# Patient Record
Sex: Female | Born: 1995 | Race: White | Hispanic: No | Marital: Single | Smoking: Current every day smoker
Health system: Southern US, Community
[De-identification: ages and names within clinical notes are randomized; demographics above are authoritative.]

## PROBLEM LIST (undated history)

## (undated) ENCOUNTER — Inpatient Hospital Stay (HOSPITAL_COMMUNITY): Payer: Self-pay

## (undated) DIAGNOSIS — Z789 Other specified health status: Secondary | ICD-10-CM

## (undated) DIAGNOSIS — D649 Anemia, unspecified: Secondary | ICD-10-CM

## (undated) DIAGNOSIS — F99 Mental disorder, not otherwise specified: Secondary | ICD-10-CM

## (undated) DIAGNOSIS — I959 Hypotension, unspecified: Secondary | ICD-10-CM

## (undated) HISTORY — PX: NO PAST SURGERIES: SHX2092

## (undated) HISTORY — DX: Mental disorder, not otherwise specified: F99

## (undated) HISTORY — DX: Anemia, unspecified: D64.9

---
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
## 2014-01-23 ENCOUNTER — Telehealth: Payer: Self-pay | Admitting: General Practice

## 2014-01-23 ENCOUNTER — Ambulatory Visit: Payer: Medicaid Other | Admitting: Obstetrics & Gynecology

## 2014-01-23 NOTE — Telephone Encounter (Signed)
Patient no showed for appt today. Called patient and she states she knows she missed her appt and would like it rescheduled. Told patient I would have the front office staff contact her. Patient verbalized understanding and had no questions

## 2014-03-23 ENCOUNTER — Ambulatory Visit: Payer: Medicaid Other | Admitting: Obstetrics and Gynecology

## 2014-03-23 ENCOUNTER — Encounter: Payer: Self-pay | Admitting: Obstetrics and Gynecology

## 2014-03-24 ENCOUNTER — Encounter: Payer: Self-pay | Admitting: General Practice

## 2014-04-14 ENCOUNTER — Encounter (HOSPITAL_COMMUNITY): Payer: Self-pay | Admitting: Emergency Medicine

## 2014-04-14 ENCOUNTER — Emergency Department (HOSPITAL_COMMUNITY)
Admission: EM | Admit: 2014-04-14 | Discharge: 2014-04-15 | Disposition: A | Discharge status: Home / Self Care | Payer: Medicaid Other | Attending: Emergency Medicine | Admitting: Emergency Medicine

## 2014-04-14 DIAGNOSIS — Z8679 Personal history of other diseases of the circulatory system: Secondary | ICD-10-CM | POA: Insufficient documentation

## 2014-04-14 DIAGNOSIS — R51 Headache: Secondary | ICD-10-CM | POA: Insufficient documentation

## 2014-04-14 DIAGNOSIS — Z880 Allergy status to penicillin: Secondary | ICD-10-CM | POA: Diagnosis not present

## 2014-04-14 DIAGNOSIS — J039 Acute tonsillitis, unspecified: Secondary | ICD-10-CM | POA: Insufficient documentation

## 2014-04-14 DIAGNOSIS — R110 Nausea: Secondary | ICD-10-CM | POA: Insufficient documentation

## 2014-04-14 DIAGNOSIS — Z3202 Encounter for pregnancy test, result negative: Secondary | ICD-10-CM | POA: Insufficient documentation

## 2014-04-14 DIAGNOSIS — F17200 Nicotine dependence, unspecified, uncomplicated: Secondary | ICD-10-CM | POA: Diagnosis not present

## 2014-04-14 DIAGNOSIS — J029 Acute pharyngitis, unspecified: Secondary | ICD-10-CM | POA: Diagnosis present

## 2014-04-14 DIAGNOSIS — J038 Acute tonsillitis due to other specified organisms: Secondary | ICD-10-CM

## 2014-04-14 DIAGNOSIS — B9689 Other specified bacterial agents as the cause of diseases classified elsewhere: Secondary | ICD-10-CM

## 2014-04-14 LAB — RAPID STREP SCREEN (MED CTR MEBANE ONLY): Streptococcus, Group A Screen (Direct): NEGATIVE

## 2014-04-14 MED ORDER — KETOROLAC TROMETHAMINE 60 MG/2ML IM SOLN: 60.0000 mg | Freq: Once | INTRAMUSCULAR | Status: DC

## 2014-04-14 MED ORDER — PROCHLORPERAZINE MALEATE 10 MG PO TABS: 10.0000 mg | ORAL_TABLET | Freq: Once | ORAL | Status: DC

## 2014-04-14 NOTE — ED Notes (Signed)
Patient here with complaint of sore throat and headache. States that headache has been present since the birth of her child several months ago. Tonight headache is largely unchanged, but patient states that medications prescribed are not helping to relieve it. Additionally patient developed a sore throat about a week ago which she states became worse yesterday and she fears it may be strep throat. Reports fevers at home with some body aches yesterday. No fever present upon arrival to ED.

## 2014-04-14 NOTE — ED Provider Notes (Signed)
CSN: [id number]     Arrival date & time 04/14/14  1933 History   First MD Initiated Contact with Patient 04/14/14 2254     Chief Complaint  Patient presents with  . Migraine  . Sore Throat     (Consider location/radiation/quality/duration/timing/severity/associated sxs/prior Treatment) HPI Catherine Bruce is a 18 year old female with past medical history of hypotension, recurrent headaches status post her last pregnancy ending 4 months ago with 3 days of sore throat and 4 months of headache. Patient states her headaches have been intermittent for the past 4 months, and she has seen her primary care physician for same. She states there is nothing new regarding her headache tonight. Patient states her sore throat began on Wednesday, and her last 3 days gradually became worse. Patient states she has had multiple strep infections in the past and is worried about a recurrent strep infection tonight. She reports associated nonproductive cough, subjective fever of up to 101 yesterday, and some mild associated nausea which patient states is typical of her headaches. Patient denies any associated shortness of breath, trouble swallowing, drooling, jaw stiffness, neck stiffness, vomiting, abdominal pain, dysuria. Patient states she has not tried taking anything for her pain.  Past Medical History  Diagnosis Date  . Medical history non-contributory   . Hypotension    Past Surgical History  Procedure Laterality Date  . No past surgeries     Family History  Problem Relation Age of Onset  . Asthma Mother   . Asthma Son    History  Substance Use Topics  . Smoking status: Current Every Day Smoker -- 0.25 packs/day  . Smokeless tobacco: Never Used  . Alcohol Use: No   OB History   Grav Para Term Preterm Abortions TAB SAB Ect Mult Living   0      2     Review of Systems  Constitutional: Positive for fever. Negative for chills.  HENT: Positive for postnasal drip and sore throat. Negative for  congestion, dental problem, drooling, facial swelling, rhinorrhea, sinus pressure, sneezing, trouble swallowing and voice change.   Eyes: Negative for visual disturbance.  Respiratory: Positive for cough. Negative for shortness of breath, wheezing and stridor.   Cardiovascular: Negative for chest pain.  Gastrointestinal: Positive for nausea. Negative for vomiting and abdominal pain.  Genitourinary: Negative for dysuria.  Musculoskeletal: Negative for neck pain and neck stiffness.  Skin: Negative for rash.  Neurological: Negative for dizziness, weakness, numbness and headaches.  Psychiatric/Behavioral: Negative.       Allergies  Penicillins  Home Medications   Prior to Admission medications   Medication Sig Start Date End Date Taking? Authorizing Provider  azithromycin (ZITHROMAX) 500 MG tablet Take 1 tablet (500 mg total) by mouth daily. Use once daily until finished. 04/15/14   Monte Fantasia, PA-C  ibuprofen (ADVIL,MOTRIN) 600 MG tablet Take 1 tablet (600 mg total) by mouth every 6 (six) hours as needed. 04/15/14   Monte Fantasia, PA-C   BP 94/58  Pulse 75  Temp(Src) 98.3 F (36.8 C) (Oral)  Resp 16  Ht  (1.422 m)  Wt 88 lb (39.917 kg)  BMI 19.74 kg/m2  SpO2 100%  LMP 03/10/2013  Breastfeeding? No Physical Exam  Nursing note and vitals reviewed. Constitutional: She is oriented to person, place, and time. She appears well-developed and well-nourished. No distress.  HENT:  Head: Normocephalic and atraumatic.  Nose: Nose normal.  Mouth/Throat: Uvula is midline. No trismus in the jaw. No uvula swelling.  Posterior oropharyngeal edema and posterior oropharyngeal erythema present. No oropharyngeal exudate or tonsillar abscesses.  Mild edema, erythema noted to patient's tonsils bilaterally. Patient's tonsils swollen almost to the point of touching uvula. No uvular swelling, no obvious tonsillar abscess. Patient's posterior oropharynx not occluded by swelling. Exudates noted on  tonsils bilaterally.  Eyes: Right eye exhibits no discharge. Left eye exhibits no discharge. No scleral icterus.  Neck: Normal range of motion and full passive range of motion without pain. Neck supple. No rigidity. No edema, no erythema and normal range of motion present.  Cardiovascular: Normal rate, regular rhythm and normal heart sounds.   No murmur heard. Pulmonary/Chest: Effort normal and breath sounds normal. No respiratory distress.  Abdominal: Soft. There is no tenderness.  Musculoskeletal: Normal range of motion. She exhibits no edema and no tenderness.  Neurological: She is alert and oriented to person, place, and time. She has normal strength. No cranial nerve deficit or sensory deficit. She displays a negative Romberg sign. Coordination and gait normal.  Skin: Skin is warm and dry. No rash noted. She is not diaphoretic.  Psychiatric: She has a normal mood and affect.    ED Course  Procedures (including critical care time) Labs Review Labs Reviewed  URINALYSIS, ROUTINE W REFLEX MICROSCOPIC - Abnormal; Notable for the following:    Color, Urine AMBER (*)    APPearance CLOUDY (*)    Bilirubin Urine SMALL (*)    Ketones, ur 15 (*)    All other components within normal limits  RAPID STREP SCREEN  CULTURE, GROUP A STREP  PREGNANCY, URINE    Imaging Review No results found.   EKG Interpretation None      MDM   Final diagnoses:  Acute bacterial tonsillitis    18 year old female with chronic headache and new sore throat. Workup tonight for strep pharyngitis/tonsillitis. We'll treat patient's pain in her throat and her headache, however I do not believe this headache warrants any further workup based on patient's history and benign neuro exam. Patient's tonsils are swollen, however patient states she has been told by a primary care physician in the past that she may require tonsillectomy in the future due to to chronic swollen tonsils. Patient mildly hypotensive, however  patient states this is baseline for her and she has been worked up for in the past. With this history and the patient's weight, I am not concerned about her current blood pressure.  Pt afebrile with tonsillar exudate, cervical lymphadenopathy, & dysphagia; diagnosis of strep. Treated in the Ed with NSAID and Compazine for mild nausea.  Pt appears mildly dehydrated, discussed importance of water rehydration. Presentation non concerning for PTA or infxn spread to soft tissue. No trismus or uvula deviation. Specific return precautions discussed. Pt able to drink water in ED without difficulty with intact air way. Based on Cent[facility] criteria pt has tonsillar exudates, tender anterior cervical adenopathy, fever by history. We will therefore treat with antibiotics outpatient and recommended close followup with her primary care physician. Patient states she currently does not have a primary care physician. We provided her with resource guide to help her find one. We encouraged patient to call or return to the ER should her symptoms change, worsen or should she have any questions or concerns.     BP 94/58  Pulse 75  Temp(Src) 98.3 F (36.8 C) (Oral)  Resp 16  Ht  (1.422 m)  Wt 88 lb (39.917 kg)  BMI 19.74 kg/m2  SpO2 100%  LMP 03/10/2013  Breastfeeding? No  Signed,  Ladona Mow, PA-C 1:55 AM  This patient discussed with Dr. Mirian Mo, MD.     Monte Fantasia, PA-C 04/15/14 0155

## 2014-04-15 LAB — URINALYSIS, ROUTINE W REFLEX MICROSCOPIC
GLUCOSE, UA: NEGATIVE mg/dL
Hgb urine dipstick: NEGATIVE
Ketones, ur: 15 mg/dL — AB
LEUKOCYTES UA: NEGATIVE
Nitrite: NEGATIVE
PH: 5.5 (ref 5.0–8.0)
Protein, ur: NEGATIVE mg/dL
SPECIFIC GRAVITY, URINE: 1.028 (ref 1.005–1.030)
Urobilinogen, UA: 1 mg/dL (ref 0.0–1.0)

## 2014-04-15 LAB — PREGNANCY, URINE: PREG TEST UR: NEGATIVE

## 2014-04-15 MED ORDER — KETOROLAC TROMETHAMINE 60 MG/2ML IM SOLN
60.0000 mg | Freq: Once | INTRAMUSCULAR | Status: AC
  Administered 2014-04-15: 60 mg via INTRAMUSCULAR
  Filled 2014-04-15: qty 2

## 2014-04-15 MED ORDER — PROCHLORPERAZINE MALEATE 10 MG PO TABS
10.0000 mg | ORAL_TABLET | Freq: Once | ORAL | Status: AC
  Administered 2014-04-15: 10 mg via ORAL
  Filled 2014-04-15: qty 1

## 2014-04-15 MED ORDER — IBUPROFEN 600 MG PO TABS: 600.0000 mg | ORAL_TABLET | Freq: Four times a day (QID) | ORAL | Status: DC | PRN

## 2014-04-15 MED ORDER — AZITHROMYCIN 500 MG PO TABS: 500.0000 mg | ORAL_TABLET | Freq: Every day | ORAL | Status: DC

## 2014-04-15 NOTE — ED Notes (Signed)
Pt. Stable and ambulatory at discharge. Alert and oriented x4. Denies further needs at this time.

## 2014-04-15 NOTE — Discharge Instructions (Signed)
Use azithromycin 1 tablet once a day until finished. You may alternate ibuprofen and Tylenol every 3-4 hours as needed for pain. Return to the ER if you develop any high fever, trouble breathing, trouble swallowing, drooling, neck stiffness, jaw stiffness, nausea vomiting. Followup with primary care physician. Refer to resource guide below.   Tonsillitis Tonsillitis is an infection of the throat that causes the tonsils to become red, tender, and swollen. Tonsils are collections of lymphoid tissue at the back of the throat. Each tonsil has crevices (crypts). Tonsils help fight nose and throat infections and keep infection from spreading to other parts of the body for the first 18 months of life.  CAUSES Sudden (acute) tonsillitis is usually caused by infection with streptococcal bacteria. Long-lasting (chronic) tonsillitis occurs when the crypts of the tonsils become filled with pieces of food and bacteria, which makes it easy for the tonsils to become repeatedly infected. SYMPTOMS  Symptoms of tonsillitis include:  A sore throat, with possible difficulty swallowing.  White patches on the tonsils.  Fever.  Tiredness.  New episodes of snoring during sleep, when you did not snore before.  Small, foul-smelling, yellowish-white pieces of material (tonsilloliths) that you occasionally cough up or spit out. The tonsilloliths can also cause you to have bad breath. DIAGNOSIS Tonsillitis can be diagnosed through a physical exam. Diagnosis can be confirmed with the results of lab tests, including a throat culture. TREATMENT  The goals of tonsillitis treatment include the reduction of the severity and duration of symptoms and prevention of associated conditions. Symptoms of tonsillitis can be improved with the use of steroids to reduce the swelling. Tonsillitis caused by bacteria can be treated with antibiotic medicines. Usually, treatment with antibiotic medicines is started before the cause of the  tonsillitis is known. However, if it is determined that the cause is not bacterial, antibiotic medicines will not treat the tonsillitis. If attacks of tonsillitis are severe and frequent, your health care provider may recommend surgery to remove the tonsils (tonsillectomy). HOME CARE INSTRUCTIONS   Rest as much as possible and get plenty of sleep.  Drink plenty of fluids. While the throat is very sore, eat soft foods or liquids, such as sherbet, soups, or instant breakfast drinks.  Eat frozen ice pops.  Gargle with a warm or cold liquid to help soothe the throat. Mix 1/4 teaspoon of salt and 1/4 teaspoon of baking soda in 8 oz of water. SEEK MEDICAL CARE IF:   Large, tender lumps develop in your neck.  A rash develops.  A green, yellow-brown, or bloody substance is coughed up.  You are unable to swallow liquids or food for 24 hours.  You notice that only one of the tonsils is swollen. SEEK IMMEDIATE MEDICAL CARE IF:   You develop any new symptoms such as vomiting, severe headache, stiff neck, chest pain, or trouble breathing or swallowing.  You have severe throat pain along with drooling or voice changes.  You have severe pain, unrelieved with recommended medications.  You are unable to fully open the mouth.  You develop redness, swelling, or severe pain anywhere in the neck.  You have a fever. MAKE SURE YOU:   Understand these instructions.  Will watch your condition.  Will get help right away if you are not doing well or get worse. Document Released: 04/30/2005 Document Revised: 12/05/2013 Document Reviewed: 01/07/2013 [facility] Patient Information 2015 [place], [place]. This information is not intended to replace advice given to you by your health care provider. Make  sure you discuss any questions you have with your health care provider.   Emergency Department Resource Guide 1) Find a Doctor and Pay Out of Pocket Although you won't have to find out who is covered by  your insurance plan, it is a good idea to ask around and get recommendations. You will then need to call the office and see if the doctor you have chosen will accept you as a new patient and what types of options they offer for patients who are self-pay. Some doctors offer discounts or will set up payment plans for their patients who do not have insurance, but you will need to ask so you aren't surprised when you get to your appointment.  2) Contact Your Local Health Department Not all health departments have doctors that can see patients for sick visits, but many do, so it is worth a call to see if yours does. If you don't know where your local health department is, you can check in your phone book. The CDC also has a tool to help you locate your state's health department, and many state websites also have listings of all of their local health departments.  3) Find a Walk-in Clinic If your illness is not likely to be very severe or complicated, you may want to try a walk in clinic. These are popping up all over the country in pharmacies, drugstores, and shopping centers. They're usually staffed by nurse practitioners or physician assistants that have been trained to treat common illnesses and complaints. They're usually fairly quick and inexpensive. However, if you have serious medical issues or chronic medical problems, these are probably not your best option.  No Primary Care Doctor: - Call Health Connect at  [phone] - they can help you locate a primary care doctor that  accepts your insurance, provides certain services, etc. - Physician Referral Service- [phone]  Chronic Pain Problems: Organization         Address  Phone   Notes  [facility]  [phone] Patients need to be referred by their primary care doctor.   Medication Assistance: Organization         Address  Phone   Notes  [facility] Medication [facility] [address] [place] [place], [place] [address] [phone] --Must be a resident of [facility] -- Must have NO insurance coverage whatsoever (no Medicaid/ Medicare, etc.) -- The pt. MUST have a primary care doctor that directs their care regularly and follows them in the community   [facility]  [phone]   [employer]  [phone]    Agencies that provide inexpensive medical care: Organization         Address  Phone   Notes  [facility]  [phone]   [facility]    [phone]   [facility] [address] [place], [place] [address] [phone]   [facility] [address], [place] [phone]   Planned Parenthood    [phone]   [facility]    [phone]   [facility] and [facility]  [address], [place] Phone:  [phone], Fax:  [phone] Hours of Operation:  9 am - 6 pm, M-F.  Also accepts Medicaid/Medicare and self-pay.  [facility]  [address], [place] Phone: [phone], Fax: [phone]. Hours of Operation:  8:30 am -  5:30 pm, M-F.  Also accepts Medicaid and self-pay.  [facility] [place] [address] [place] Phone: [phone]   [facility] [address] Natasha Bence [place], [place] [phone], Ext. [phone] Mondays & Thursdays: 7-9 AM.  First 15 patients are seen on a first come, first serve basis.    Medicaid-accepting [facility] Providers:  Organization         Address  Phone   Notes  [facility] [address], [place] [phone] Also accepts self-pay patients.  [facility] [address] Laurell Josephs [place], [place]  [phone]   [facility] [place] [address], [place] [phone]   [facility] [place] [facility] [address], [place] [phone]   Renaye Rakers [address], [place]   [phone] Only accepts [place] patients after they have their name applied to their card.   Self-Pay (no insurance) in [facility]:  Organization         Address  Phone   Notes  Sickle Cell Patients, [facility] [address] [place], [place] [phone]   [facility] [address] [place], [place] [phone]   [facility] [place]  [address], [place] [phone]   [facility]/Dr. Osei-Bonsu  [address] [place] or [address], [place] [phone] Phone number for both [place] and [place] locations is the same.  [facility] [address] [place] [phone]   [facility] [address] [place] or [address] [phone]   [facility] [address], [place] [phone], phone; [phone], fax Sees patients 1st and 3rd Saturday of every month.  Must not qualify for public or private insurance (i.e. Medicaid, Medicare, [place] Health Choice, Veterans' Benefits)  Household income should be no more than 200% of the poverty level The clinic cannot treat you if you are pregnant or think you are pregnant  Sexually transmitted diseases are not treated at the clinic.    Dental Care: Organization         Address  Phone  Notes  [facility] Department of [facility] [address] [place], [place] [phone] Accepts children up to age 36 who are enrolled in [place] or [place] Health Choice; pregnant women with a Medicaid card; and children who have applied for Medicaid or [place] Health Choice, but were declined, whose parents can pay a reduced fee at time of service.  [facility] Department of [facility]  [address], [place] [phone] Accepts children up to age 57 who are enrolled  in [place] or [place] Health Choice; pregnant women with a Medicaid card; and children who have applied for Medicaid or [place] Health Choice, but were declined, whose parents can pay a reduced fee at time of service.  [facility]  [address] [place], [place] [phone] Patients are seen by appointment only. Walk-ins are not accepted. [facility] will see patients 11 years of age and older. Monday - Tuesday (8am-5pm) Most Wednesdays (8:30-5pm) $30 per visit, cash only  [facility] Adult Dental Access PROGRAM  [address], [facility] [phone] Patients are seen by appointment only. Walk-ins are  not accepted. [facility] will see patients 90 years of age and older. One Wednesday Evening (Monthly: Volunteer Based).  $30 per visit, cash only  [employer]  [phone] for adults; Children under age 45, call Graduate Pediatric Dentistry at [phone]. Children aged 32-14, please call [phone] to request a pediatric application.  Dental services are provided in all areas of dental care including fillings, crowns and bridges, complete and partial dentures, implants, gum treatment, root canals, and extractions. Preventive care is also provided. Treatment is provided to both adults and children. Patients are selected via a lottery and there is often a waiting list.   [facility] [address] [place]  [phone] [email or web address]   [facility] [address] [place], [place] [phone], Ext. [phone] Second and Fourth Thursday of each month, opens at 6:30 AM; Clinic ends at 9 AM.  Patients are seen on a first-come first-served basis, and a limited number are seen during each clinic.   [facility]  [address] Ether Griffins [place], [place] [phone]   Eligibility Requirements You must have lived in [place], [place], or [place] counties for at least the last three months.   You cannot be  eligible for state or federal sponsored [place], including [employer], [place], or [employer].   You generally cannot be eligible for healthcare insurance through your employer.    How to apply: Eligibility screenings are held every Tuesday and Wednesday afternoon from 1:00 pm until 4:00 pm. You do not need an appointment for the interview!  [facility] [address], [place], [place] [phone]   [facility] Health Department  [phone]   [facility] Health Department  [phone]   [facility] Health Department  [phone]    Behavioral Health Resources in the Community: Intensive Outpatient Programs Organization         Address  Phone  Notes  [facility] [address] [place], [place] [phone]   [facility] Outpatient [address], [place], [place] [phone]   [facility] [address], [place], [place]  [phone]   [facility] Mental Health [address]  [place], [place] [phone] or [phone]   Substance Abuse Resources Organization         Address  Phone  Notes  [facility]  [phone]   [facility]  [phone]   [facility] [place]  [phone]   Floydene Flock  [phone]   Residential & Outpatient Substance Abuse Program  3134744888   Psychological Services Organization         Address  Phone  Notes  [facility]al Health  [phone]   [facility]  [phone]   [facility] Mental Health [address], [place] [phone] or [phone]    Mobile Crisis Teams Organization         Address  Phone  Notes  [facility], Mobile Crisis Care Unit  [phone]   [facility]  [address] [place], [place] [phone]   Doristine Locks [address] [place] [phone]    Self-Help/Support Groups Organization          Address  Phone             Notes  [facility] [place] - variety of support groups  336- I7437963 Call for more information  Narcotics Anonymous (NA),  [facility] [address], [employer] [place]  2 meetings at this location   Residential Sports administrator         Address  Phone  Notes  [facility] [address],    [place]  [phone]   [facility]  [address] [place] [address], [place], [place] [phone]   [facility] [address] [place], [place] [phone] Admissions: 8am-3pm M-F  [facility] [address],    [place], [place] [phone]   [facility] [address] [place], [place], [place] [phone]   [facility] [address],  [place], [place] [phone]   [facility] - Intensive Outpatient [address] Laurell Joseph[address], [place], [place] [phone]   [facility] ([facility] [address] [place].,  [place], [place] [phone] or [phone]   [facility] ([facility]) [address], [place], [place] [phone] Accepts Medicaid  [facility] [place] [address]  [place] [phone] Substance Abuse/Addiction Treatment   [facility] Organization         Address  Phone  Notes  [facility]  [phone]   Angie Fava, PhD [address] Ervin Knack [place], [place]   [phone] or [phone]   [facility] Behavioral   [address] [place], [place] [phone]   [facility] [address] [place], [place] [phone] Insurance/Medicaid/sponsorship through [facility] and Families [address]                                    [place], [place] [phone] Therapy/tele-psych/case  [facility] [address], [place] [phone]    Dr. Lolly Mustache  [phone]   [facility]  [employer]  [facility] Dept. 1) [address], [place] 2) [address] [place] 3)  [address], [place] [phone]  [phone]   [facility] Child Abuse Hotline 440-815-4560 or [phone] (After Hours)

## 2014-04-16 LAB — CULTURE, GROUP A STREP

## 2014-04-16 NOTE — ED Provider Notes (Signed)
Medical screening examination/treatment/procedure(s) were performed by non-physician practitioner and as supervising physician I was immediately available for consultation/collaboration.   EKG Interpretation None        Mirian Mo, MD 04/16/14 [phone]

## 2014-06-05 ENCOUNTER — Encounter (HOSPITAL_COMMUNITY): Payer: Self-pay | Admitting: Emergency Medicine

## 2020-04-06 ENCOUNTER — Ambulatory Visit: Payer: Self-pay

## 2020-04-06 ENCOUNTER — Encounter: Payer: Self-pay | Admitting: Physician Assistant

## 2020-04-06 ENCOUNTER — Ambulatory Visit (LOCAL_COMMUNITY_HEALTH_CENTER): Payer: Medicaid Other | Admitting: Physician Assistant

## 2020-04-06 ENCOUNTER — Other Ambulatory Visit: Payer: Self-pay

## 2020-04-06 VITALS — BP 108/69 | Ht <= 58 in | Wt 125.2 lb

## 2020-04-06 DIAGNOSIS — Z01419 Encounter for gynecological examination (general) (routine) without abnormal findings: Secondary | ICD-10-CM

## 2020-04-06 DIAGNOSIS — Z3009 Encounter for other general counseling and advice on contraception: Secondary | ICD-10-CM

## 2020-04-06 DIAGNOSIS — Z3046 Encounter for surveillance of implantable subdermal contraceptive: Secondary | ICD-10-CM | POA: Diagnosis not present

## 2020-04-06 DIAGNOSIS — Z113 Encounter for screening for infections with a predominantly sexual mode of transmission: Secondary | ICD-10-CM

## 2020-04-06 MED ORDER — ETONOGESTREL 68 MG ~~LOC~~ IMPL
68.0000 mg | DRUG_IMPLANT | Freq: Once | SUBCUTANEOUS | Status: AC
  Administered 2020-04-06: 68 mg via SUBCUTANEOUS

## 2020-04-06 NOTE — Progress Notes (Signed)
Here today for PE and Nexplanon removal. New patient to health department. Reports last PE, Pap Smear and Nexplanon Insertion was 03/26/2017 in [place], [place]. Declines STD screening today. Verbal PHQ9 completed in chart. Would like to discuss alternate birth control options. Tawny Hopping, RN

## 2020-04-06 NOTE — Progress Notes (Signed)
[facility] DEPARTMENT [facility] [address] Main Number: [phone]    Family Planning Visit- Initial Visit  Subjective:  Catherine Bruce is a 24 y.o.  [phone]   being seen today for an initial well woman visit and to discuss family planning options.  She is currently using Nexplanon for pregnancy prevention. Patient reports she does not want a pregnancy in the next year.  Patient has the following medical conditions has Rubella non-immune status, antepartum; Late prenatal care complicating pregnancy in third trimester; and NSVD (normal spontaneous vaginal delivery) on their problem list.  Chief Complaint  Patient presents with  . Contraception    Physical Exam    Patient reports that she has had a lot of irregular bleeding with the Nexplanon as well as weight gain.  Thinks that she would like to change to other method but is not sure.  Reports history of migraines with dizziness and blurry vision during the headache but denies aura, vision loss and any numbness or tingling .  States that she had a "pimple" on her left areola that has now resolved.  Denies increase or changes to her headaches.  Reports that she has started eating a more healthy diet and exercising to lose weight.  Reports increased frequency of urination without other symptoms.  Reports recent move to this area so has to find PCP and therapist to be followed by for Bipolar disorder.   Patient denies other concerns today.   Body mass index is 27.09 kg/m. - Patient is eligible for diabetes screening based on BMI and age >32?  not applicable HA1C ordered? not applicable  Patient reports 1 of partners in last year. Desires STI screening?  No - patient declines.  Has patient been screened once for HCV in the past?  No  No results found for: HCVAB  Does the patient have current drug use (including MJ), have a partner with drug use, and/or has been incarcerated since last result? No   If yes-- Screen for HCV through [facility]   Does the patient meet criteria for HBV testing? No  Criteria:  -Household, sexual or needle sharing contact with HBV -History of drug use -HIV positive -Those with known Hep C   Health Maintenance Due  Topic Date Due  . Hepatitis C Screening  Never done  . COVID-19 Vaccine (1) Never done  . TETANUS/TDAP  Never done  . PAP-Cervical Cytology Screening  Never done  . PAP SMEAR-Modifier  Never done  . INFLUENZA VACCINE  Never done    Review of Systems  All other systems reviewed and are negative.   The following portions of the patient's history were reviewed and updated as appropriate: allergies, current medications, past family history, past medical history, past social history, past surgical history and problem list. Problem list updated.   See flowsheet for other program required questions.  Objective:   Vitals:   04/06/20 1035  BP: 108/69  Weight: 125 lb 3.2 oz (56.8 kg)  Height: 4\' 9"  (1.448 m)    Physical Exam Vitals and nursing note reviewed.  Constitutional:      General: She is not in acute distress.    Appearance: Normal appearance.  HENT:     Head: Normocephalic and atraumatic.  Eyes:     Conjunctiva/sclera: Conjunctivae normal.  Neck:     Thyroid: No thyroid mass, thyromegaly or thyroid tenderness.  Cardiovascular:     Rate and Rhythm: Normal rate and regular  rhythm.  Pulmonary:     Effort: Pulmonary effort is normal.     Breath sounds: Normal breath sounds.  Chest:     Breasts:        Right: Normal. No mass, nipple discharge, skin change or tenderness.        Left: Normal. No mass, nipple discharge, skin change or tenderness.  Abdominal:     Palpations: Abdomen is soft. There is no mass.     Tenderness: There is no abdominal tenderness. There is no guarding or rebound.  Genitourinary:    General: Normal vulva.     Rectum: Normal.     Comments: External genitalia/pubic area without nits, lice,  edema, erythema, lesions and inguinal adenopathy. Vagina with normal mucosa and discharge. Cervix without visible lesions. Uterus firm, mobile, nt, no masses, no CMT, no adnexal tenderness or fullness. Musculoskeletal:     Cervical back: Neck supple. No tenderness.  Lymphadenopathy:     Cervical: No cervical adenopathy.     Upper Body:     Right upper body: No supraclavicular, axillary or pectoral adenopathy.     Left upper body: No supraclavicular, axillary or pectoral adenopathy.  Skin:    General: Skin is warm and dry.     Findings: No bruising, erythema, lesion or rash.  Neurological:     Mental Status: She is alert and oriented to person, place, and time.  Psychiatric:        Mood and Affect: Mood normal.        Behavior: Behavior normal.        Thought Content: Thought content normal.        Judgment: Judgment normal.       Assessment and Plan:  Catherine Bruce is a 25 y.o. female presenting to the [facility] Department for an initial well woman exam/family planning visit  Contraception counseling: Reviewed all forms of birth control options in the tiered based approach. available including abstinence; over the counter/barrier methods; hormonal contraceptive medication including pill, patch, ring, injection,contraceptive implant, ECP; hormonal and nonhormonal IUDs; permanent sterilization options including vasectomy and the various tubal sterilization modalities. Risks, benefits, and typical effectiveness rates were reviewed.  Questions were answered.  Written information was also given to the patient to review.  Patient desires Nexplanon removal and reinsertion, this was prescribed for patient. She will follow up in  1 year and prn for surveillance.  She was told to call with any further questions, or with any concerns about this method of contraception.  Emphasized use of condoms 100% of the time for STI prevention.  Patient was not a candidate for ECP today.  1.  Encounter for counseling regarding contraception Counseled patient as above re:  BCMs. Patient opts to have Nexplanon removal/reinsertion today. Rec condoms with all sex for STD protection.  2. Well woman exam with routine gynecological exam Reviewed with patient healthy habits for general health. Enc MVI 1 po daily. Enc to establish with PCP for primary care concerns and illness. Enc to establish with Mental Health provider for follow up for Bipolar Disorder. Await pap results.  Counseled that RN will call or send letter once results are back.  - Pap IG (Image Guided)  3. Encounter for removal and reinsertion of Nexplanon Nexplanon Removal and Insertion  Patient identified, informed consent performed, consent signed.   Patient does understand that irregular bleeding is a very common side effect of this medication. She was advised to have backup contraception for one week after  replacement of the implant. Patient deemed to meet WHO criteria for being reasonably certain she is not pregnant.  Appropriate time out taken. Nexplanon site identified. Area prepped in usual sterile fashon. 3 ml of 1% lidocaine with epinephrine was used to anesthetize the area at the distal end of the implant. A small stab incision was made right beside the implant on the distal portion. The Nexplanon rod was grasped manually and removed without difficulty. There was minimal blood loss. There were no complications.   Confirmed correct location of insertion site. The insertion site was identified 8-10 cm (3-4 inches) from the medial epicondyle of the humerus and 3-5 cm (1.25-2 inches) posterior to (below) the sulcus (groove) between the biceps and triceps muscles of the patient's Right arm. New Nexplanon removed from packaging, Device confirmed in needle, then inserted full length of needle and withdrawn per handbook instructions. Nexplanon was able to palpated in the patient's Right arm; patient palpated the insert herself.   There was minimal blood loss. Patient insertion site covered with guaze and a pressure bandage to reduce any bruising. The patient tolerated the procedure well and was given post procedure instructions.   Nexplanon:   Counseled patient to take OTC analgesic starting as soon as lidocaine starts to wear off and take regularly for at least 48 hr to decrease discomfort.  Specifically to take with food or milk to decrease stomach upset and for IB 600 mg (3 tablets) every 6 hrs; IB 800 mg (4 tablets) every 8 hrs; or Aleve 2 tablets every 12 hrs.   - etonogestrel (NEXPLANON) implant 68 mg  4. Screening for STD (sexually transmitted disease) Await test results.  Counseled that RN will call if needs to RTC for treatment once results are back.  - HIV/HCV [facility] - Syphilis Serology,  Lab     Return in about 1 year (around 04/06/2021) for RP and prn.  No future appointments.  Matt Holmes, PA

## 2020-04-13 LAB — PAP IG (IMAGE GUIDED): PAP Smear Comment: 0

## 2020-04-17 ENCOUNTER — Encounter: Payer: Self-pay | Admitting: Family Medicine

## 2020-04-17 LAB — HM HIV SCREENING LAB: HM HIV Screening: NEGATIVE

## 2020-04-17 LAB — HM HEPATITIS C SCREENING LAB: HM Hepatitis Screen: NEGATIVE

## 2020-05-05 ENCOUNTER — Other Ambulatory Visit: Payer: Self-pay

## 2020-05-05 ENCOUNTER — Encounter: Payer: Self-pay | Admitting: Emergency Medicine

## 2020-05-05 ENCOUNTER — Emergency Department
Admission: EM | Admit: 2020-05-05 | Discharge: 2020-05-05 | Disposition: A | Discharge status: Home / Self Care | Payer: Self-pay | Attending: Emergency Medicine | Admitting: Emergency Medicine

## 2020-05-05 DIAGNOSIS — Z20822 Contact with and (suspected) exposure to covid-19: Secondary | ICD-10-CM | POA: Insufficient documentation

## 2020-05-05 DIAGNOSIS — J029 Acute pharyngitis, unspecified: Secondary | ICD-10-CM | POA: Insufficient documentation

## 2020-05-05 DIAGNOSIS — F17200 Nicotine dependence, unspecified, uncomplicated: Secondary | ICD-10-CM | POA: Insufficient documentation

## 2020-05-05 LAB — RESPIRATORY PANEL BY RT PCR (FLU A&B, COVID)
Influenza A by PCR: NEGATIVE
Influenza B by PCR: NEGATIVE
SARS Coronavirus 2 by RT PCR: NEGATIVE

## 2020-05-05 LAB — GROUP A STREP BY PCR: Group A Strep by PCR: NOT DETECTED

## 2020-05-05 NOTE — ED Provider Notes (Signed)
[facility] Emergency Department Provider Note  ____________________________________________   First MD Initiated Contact with Patient 05/05/20 1918     (approximate)  I have reviewed the triage vital signs and the nursing notes.   HISTORY  Chief Complaint Lymphadenopathy and Cough    HPI FLORELLA MCNEESE is a 24 y.o. female presents emergency department complaining of lymphadenopathy and a sore throat. Patient is also had a cough. Yellow mucus only in the mornings. No fever or chills. Patient is not immunized for Covid. No known Covid exposure. States her employer needs a negative Covid test for her to return to work.    Past Medical History:  Diagnosis Date  . Anemia   . Hypotension   . Medical history non-contributory   . Mental disorder    Bi Polar Disorder, anxiety    Patient Active Problem List   Diagnosis Date Noted  . NSVD (normal spontaneous vaginal delivery) 12/12/2013  . Late prenatal care complicating pregnancy in third trimester 11/30/2013  . Rubella non-immune status, antepartum 11/29/2013    Past Surgical History:  Procedure Laterality Date  . NO PAST SURGERIES      Prior to Admission medications   Not on File    Allergies Penicillins  Family History  Problem Relation Age of Onset  . Asthma Mother   . Asthma Son     Social History Social History   Tobacco Use  . Smoking status: Current Every Day Smoker    Packs/day: 0.25  . Smokeless tobacco: Never Used  Substance Use Topics  . Alcohol use: Not Currently  . Drug use: No    Review of Systems  Constitutional: No fever/chills Eyes: No visual changes. ENT: Positive sore throat. Respiratory: Positive cough Cardiovascular: Denies chest pain Gastrointestinal: Denies abdominal pain Genitourinary: Negative for dysuria. Musculoskeletal: Negative for back pain. Skin: Negative for rash. Psychiatric: no mood changes,      ____________________________________________   PHYSICAL EXAM:  VITAL SIGNS: ED Triage Vitals [05/05/20 1550]  Enc Vitals Group     BP 99/79     Pulse Rate 100     Resp 16     Temp 98.2 F (36.8 C)     Temp Source Oral     SpO2 100 %     Weight 120 lb (54.4 kg)     Height 4\' 9"  (1.448 m)     Head Circumference      Peak Flow      Pain Score 7     Pain Loc      Pain Edu?      Excl. in GC?     Constitutional: Alert and oriented. Well appearing and in no acute distress. Eyes: Conjunctivae are normal.  Head: Atraumatic. Nose: No congestion/rhinnorhea. Mouth/Throat: Mucous membranes are moist. Throat is red, no exudate noted Neck:  supple, positive bilateral cervical lymphadenopathy noted Cardiovascular: Normal rate, regular rhythm. Heart sounds are normal Respiratory: Normal respiratory effort.  No retractions, lungs c t a  GU: deferred Musculoskeletal: FROM all extremities, warm and well perfused Neurologic:  Normal speech and language.  Skin:  Skin is warm, dry and intact. No rash noted. Psychiatric: Mood and affect are normal. Speech and behavior are normal.  ____________________________________________   LABS (all labs ordered are listed, but only abnormal results are displayed)  Labs Reviewed  GROUP A STREP BY PCR  RESPIRATORY PANEL BY RT PCR (FLU A&B, COVID)   ____________________________________________   ____________________________________________  RADIOLOGY    ____________________________________________  PROCEDURES  Procedure(s) performed: No  Procedures    ____________________________________________   INITIAL IMPRESSION / ASSESSMENT AND PLAN / ED COURSE  Pertinent labs & imaging results that were available during my care of the patient were reviewed by me and considered in my medical decision making (see chart for details).   Patient is 24 year old female presents emergency department with concerns of sore throat,  lymphadenopathy, and Covid. See HPI  Physical exam shows a erythematous throat, bilateral cervical lymphadenopathy, no cough is noted and lungs are clear. Remainder exams are unremarkable  Strep test is negative Covid test is pending  Explained findings to the patient. Told her the only other concern I would have is mono which is another virus. She does not want mono testing at this time. She does need a work note. Explained her Covid test is positive she will need to quarantine for 2 weeks. This was explained to the patient in detail     KORTNIE STOVALL was evaluated in Emergency Department on 05/05/2020 for the symptoms described in the history of present illness. She was evaluated in the context of the global COVID-19 pandemic, which necessitated consideration that the patient might be at risk for infection with the SARS-CoV-2 virus that causes COVID-19. Institutional protocols and algorithms that pertain to the evaluation of patients at risk for COVID-19 are in a state of rapid change based on information released by regulatory bodies including the CDC and federal and state organizations. These policies and algorithms were followed during the patient's care in the ED.    As part of my medical decision making, I reviewed the following data within the electronic MEDICAL RECORD NUMBER Nursing notes reviewed and incorporated, Labs reviewed , Old chart reviewed, Notes from prior ED visits and [place] Controlled Substance Database  ____________________________________________   FINAL CLINICAL IMPRESSION(S) / ED DIAGNOSES  Final diagnoses:  Suspected COVID-19 virus infection  Acute pharyngitis, unspecified etiology      NEW MEDICATIONS STARTED DURING THIS VISIT:  Current Discharge Medication List       Note:  This document was prepared using Dragon voice recognition software and may include unintentional dictation errors.    Faythe Ghee, PA-C 05/05/20 2132    Gilles Chiquito,  MD 05/05/20 2135

## 2020-05-05 NOTE — Discharge Instructions (Signed)
Follow-up with your regular doctor if not improving in 3 days.  Return emergency department worsening. 

## 2020-05-05 NOTE — ED Triage Notes (Signed)
Pt here for lymphadenopathy to right submandibular area.  Has had yellow productive cough.  No fevers. Unlabored. VSS

## 2020-06-14 ENCOUNTER — Ambulatory Visit
Admission: EM | Admit: 2020-06-14 | Discharge: 2020-06-14 | Disposition: A | Discharge status: Home / Self Care | Payer: Medicaid Other | Attending: Family Medicine | Admitting: Family Medicine

## 2020-06-14 ENCOUNTER — Other Ambulatory Visit: Payer: Self-pay

## 2020-06-14 DIAGNOSIS — Z793 Long term (current) use of hormonal contraceptives: Secondary | ICD-10-CM | POA: Insufficient documentation

## 2020-06-14 DIAGNOSIS — J029 Acute pharyngitis, unspecified: Secondary | ICD-10-CM

## 2020-06-14 DIAGNOSIS — Z20822 Contact with and (suspected) exposure to covid-19: Secondary | ICD-10-CM | POA: Insufficient documentation

## 2020-06-14 DIAGNOSIS — D72829 Elevated white blood cell count, unspecified: Secondary | ICD-10-CM | POA: Insufficient documentation

## 2020-06-14 DIAGNOSIS — F17210 Nicotine dependence, cigarettes, uncomplicated: Secondary | ICD-10-CM | POA: Insufficient documentation

## 2020-06-14 DIAGNOSIS — R42 Dizziness and giddiness: Secondary | ICD-10-CM | POA: Insufficient documentation

## 2020-06-14 DIAGNOSIS — H9203 Otalgia, bilateral: Secondary | ICD-10-CM | POA: Insufficient documentation

## 2020-06-14 DIAGNOSIS — R509 Fever, unspecified: Secondary | ICD-10-CM

## 2020-06-14 DIAGNOSIS — R1031 Right lower quadrant pain: Secondary | ICD-10-CM

## 2020-06-14 LAB — URINALYSIS, COMPLETE (UACMP) WITH MICROSCOPIC
Glucose, UA: NEGATIVE mg/dL
Leukocytes,Ua: NEGATIVE
Nitrite: NEGATIVE
Protein, ur: 30 mg/dL — AB
Specific Gravity, Urine: 1.02 (ref 1.005–1.030)
pH: 6 (ref 5.0–8.0)

## 2020-06-14 LAB — CBC WITH DIFFERENTIAL/PLATELET
Abs Immature Granulocytes: 0.06 10*3/uL (ref 0.00–0.07)
Basophils Absolute: 0 10*3/uL (ref 0.0–0.1)
Basophils Relative: 0 %
Eosinophils Absolute: 0 10*3/uL (ref 0.0–0.5)
Eosinophils Relative: 0 %
HCT: 36.8 % (ref 36.0–46.0)
Hemoglobin: 12.6 g/dL (ref 12.0–15.0)
Immature Granulocytes: 0 %
Lymphocytes Relative: 8 %
Lymphs Abs: 1.3 10*3/uL (ref 0.7–4.0)
MCH: 30.6 pg (ref 26.0–34.0)
MCHC: 34.2 g/dL (ref 30.0–36.0)
MCV: 89.3 fL (ref 80.0–100.0)
Monocytes Absolute: 0.9 10*3/uL (ref 0.1–1.0)
Monocytes Relative: 6 %
Neutro Abs: 12.8 10*3/uL — ABNORMAL HIGH (ref 1.7–7.7)
Neutrophils Relative %: 86 %
Platelets: 230 10*3/uL (ref 150–400)
RBC: 4.12 MIL/uL (ref 3.87–5.11)
RDW: 12.8 % (ref 11.5–15.5)
WBC: 15.1 10*3/uL — ABNORMAL HIGH (ref 4.0–10.5)
nRBC: 0 % (ref 0.0–0.2)

## 2020-06-14 LAB — MONONUCLEOSIS SCREEN: Mono Screen: NEGATIVE

## 2020-06-14 LAB — GROUP A STREP BY PCR: Group A Strep by PCR: NOT DETECTED

## 2020-06-14 NOTE — ED Provider Notes (Signed)
[facility]    CSN: [id number] Arrival date & time: 06/14/20  1719      History   Chief Complaint Chief Complaint  Patient presents with   Sore Throat   Headache   Otalgia    HPI Catherine Bruce is a 24 y.o. female.   3-year-old female here for evaluation of sore throat, bilateral ear pain, headache, intermittent dizziness, body aches, and fever up to 101.2.  Patient reports that her symptoms started 2 days ago.  Her dizziness only comes on when her headaches get more severe.  Patient states that she has had muffled hearing, clear nasal discharge, sinus pressure, and pain when she swallows.  Patient states she feels like her tonsils are affecting her ability to breathe.  Patient denies drainage from her ears, cough, shortness of breath, or wheezing.  Patient also reports that she has had increased urinary frequency and abdominal pain.     Past Medical History:  Diagnosis Date   Anemia    Hypotension    Medical history non-contributory    Mental disorder    Bi Polar Disorder, anxiety    Patient Active Problem List   Diagnosis Date Noted   NSVD (normal spontaneous vaginal delivery) 12/12/2013   Late prenatal care complicating pregnancy in third trimester 11/30/2013   Rubella non-immune status, antepartum 11/29/2013    Past Surgical History:  Procedure Laterality Date   NO PAST SURGERIES      OB History    Gravida  3   Para  3   Term  3   Preterm  0   AB  0   Living  3     SAB  0   TAB  0   Ectopic  0   Multiple  0   Live Births  3            Home Medications    Prior to Admission medications   Medication Sig Start Date End Date Taking? Authorizing Provider  etonogestrel (NEXPLANON) 68 MG IMPL implant 1 each by Subdermal route once.   Yes [provider]    Family History Family History  Problem Relation Age of Onset   Asthma Mother    Asthma Son     Social History Social History   Tobacco  Use   Smoking status: Current Every Day Smoker    Packs/day: 0.25    Types: Cigarettes   Smokeless tobacco: Never Used  Substance Use Topics   Alcohol use: Not Currently   Drug use: No     Allergies   Penicillins   Review of Systems Review of Systems  Constitutional: Positive for fatigue and fever. Negative for activity change and appetite change.  HENT: Positive for congestion, ear pain, rhinorrhea, sinus pressure and sore throat.   Respiratory: Negative for cough, shortness of breath and wheezing.   Cardiovascular: Negative for chest pain.  Gastrointestinal: Positive for abdominal pain. Negative for diarrhea, nausea and vomiting.  Genitourinary: Positive for frequency. Negative for dysuria and urgency.  Musculoskeletal: Positive for arthralgias and myalgias.  Skin: Negative for rash.  Neurological: Positive for dizziness and headaches. Negative for syncope.  Hematological: Negative.   Psychiatric/Behavioral: Negative.      Physical Exam Triage Vital Signs ED Triage Vitals  Enc Vitals Group     BP 06/14/20 1730 108/71     Pulse Rate 06/14/20 1730 (!) 119     Resp 06/14/20 1730 19     Temp 06/14/20 1730 99.7 F (  37.6 C)     Temp Source 06/14/20 1730 Oral     SpO2 06/14/20 1730 100 %     Weight 06/14/20 1727 126 lb (57.2 kg)     Height 06/14/20 1727 4' 9.5" (1.461 m)     Head Circumference --      Peak Flow --      Pain Score 06/14/20 1727 5     Pain Loc --      Pain Edu? --      Excl. in GC? --    No data found.  Updated Vital Signs BP 108/71 (BP Location: Right Arm)    Pulse (!) 119    Temp 99.7 F (37.6 C) (Oral)    Resp 19    Ht 4' 9.5" (1.461 m)    Wt 126 lb (57.2 kg)    LMP 04/16/2020    SpO2 100%    BMI 26.79 kg/m   Visual Acuity Right Eye Distance:   Left Eye Distance:   Bilateral Distance:    Right Eye Near:   Left Eye Near:    Bilateral Near:     Physical Exam Vitals and nursing note reviewed.  Constitutional:      General: She is not  in acute distress.    Appearance: She is well-developed and normal weight. She is not toxic-appearing.  HENT:     Head: Normocephalic and atraumatic.     Right Ear: Ear canal normal. Tympanic membrane is erythematous.     Left Ear: Ear canal normal. Tympanic membrane is erythematous.     Ears:     Comments: Bilateral tympanic membranes are erythematous and injected.  Both TMs are partially occluded by cerumen and unable to visualize any effusion.    Nose: Congestion and rhinorrhea present.     Comments: Nasal mucosa is erythematous and edematous with clear discharge.    Mouth/Throat:     Mouth: Mucous membranes are moist.     Pharynx: Oropharynx is clear. Posterior oropharyngeal erythema present. No oropharyngeal exudate.     Tonsils: No tonsillar exudate. 2+ on the right. 2+ on the left.     Comments: Bilateral tonsillar pillars are 2+ edematous and erythematous.  No injection or exudate noted.  Posterior oropharynx has mild erythema and clear postnasal drip. Eyes:     Conjunctiva/sclera: Conjunctivae normal.     Pupils: Pupils are equal, round, and reactive to light.  Neck:     Comments: Patient has tender, shotty anterior and posterior cervical lymphadenopathy. Cardiovascular:     Rate and Rhythm: Normal rate and regular rhythm.     Heart sounds: Normal heart sounds. No murmur heard.  No gallop.   Pulmonary:     Effort: Pulmonary effort is normal.     Breath sounds: Normal breath sounds. No wheezing or rhonchi.  Abdominal:     General: Bowel sounds are normal.     Palpations: Abdomen is soft.     Tenderness: There is abdominal tenderness. There is no guarding or rebound.     Comments: She has mild right lower quadrant tenderness.  No tenderness over McBurney's point, and no rebound.  Musculoskeletal:     Cervical back: Normal range of motion and neck supple.  Lymphadenopathy:     Cervical: Cervical adenopathy present.  Skin:    General: Skin is warm and dry.     Capillary  Refill: Capillary refill takes less than 2 seconds.     Findings: No erythema or rash.  Neurological:  General: No focal deficit present.     Mental Status: She is alert and oriented to person, place, and time.  Psychiatric:        Mood and Affect: Mood normal.        Behavior: Behavior normal.      UC Treatments / Results  Labs (all labs ordered are listed, but only abnormal results are displayed) Labs Reviewed  URINALYSIS, COMPLETE (UACMP) WITH MICROSCOPIC - Abnormal; Notable for the following components:      Result Value   APPearance HAZY (*)    Hgb urine dipstick MODERATE (*)    Bilirubin Urine SMALL (*)    Ketones, ur TRACE (*)    Protein, ur 30 (*)    Bacteria, UA FEW (*)    All other components within normal limits  CBC WITH DIFFERENTIAL/PLATELET - Abnormal; Notable for the following components:   WBC 15.1 (*)    Neutro Abs 12.8 (*)    All other components within normal limits  GROUP A STREP BY PCR  SARS CORONAVIRUS 2 (TAT 6-24 HRS)  MONONUCLEOSIS SCREEN    EKG   Radiology No results found.  Procedures Procedures (including critical care time)  Medications Ordered in UC Medications - No data to display  Initial Impression / Assessment and Plan / UC Course  I have reviewed the triage vital signs and the nursing notes.  Pertinent labs & imaging results that were available during my care of the patient were reviewed by me and considered in my medical decision making (see chart for details).   Patient is here for evaluation of multiple symptoms.  6 weeks ago she was Covid positive.  Four weeks ago she had similar symptoms and was evaluated at the ER where she had a negative Covid, flu and strep test.  Patient developed a fever 2 days ago with associated sore throat, ear pain, headache, and body aches.  Physical exam reveals erythematous injected bilateral tympanic membranes.  Inflamed nasal mucosa and clear nasal discharge.  Tonsillar hypertrophy with  erythema but no exudate.  Patient has been complaining of fatigue she has shotty, tender anterior and posterior cervical lymphadenopathy.  Patient symptoms are concerning for mono.  Patient is also had right lower quadrant abdominal pain and increased urinary frequency.  Will check CBC, mono, rapid strep, and UA.  CBC shows 15.1 WBC count with 12.8 neutrophils remainder is remarkable.  Strep PCR is negative.  Letter for moderate blood, small bilirubin, trace ketones, 30 protein, and few bacteria.  No nitrites or leukocytes.  Mono screen is negative.  Discussed findings with patient and recommended that due to her elevated white blood cell count, fever, and right lower quadrant domino pain that she go to the emergency department to have appendicitis ruled out.  Patient is opted to go to [facility] in [place].   Final Clinical Impressions(s) / UC Diagnoses   Final diagnoses:  RLQ abdominal pain  Fever, unspecified  Acute pharyngitis, unspecified etiology     Discharge Instructions     As we discussed your lab test does not show any signs of urinary tract infection, strep throat, or mono.  You do have an elevated white blood cell count indicating infection.  The concerning factor being your right lower quadrant abdominal pain.  As we discussed appendicitis needs to be ruled out as a source of your fever and elevated white blood cell count.  I am recommending that she be evaluated in the emergency department for that.  We discussed vertigo and he would indicated at [facility].  Please go to the ER at [facility] for further evaluation and to rule out appendicitis.    ED Prescriptions    None     PDMP not reviewed this encounter.   Becky Augusta, NP 06/14/20 1845

## 2020-06-14 NOTE — ED Triage Notes (Signed)
Pt started yesterday with feeling dizzy, improved mostly today. Sore throat, bilateral ear pain, headache, body aches, fever of 101.2 both yesterday and today

## 2020-06-14 NOTE — Discharge Instructions (Addendum)
As we discussed your lab test does not show any signs of urinary tract infection, strep throat, or mono.  You do have an elevated white blood cell count indicating infection.  The concerning factor being your right lower quadrant abdominal pain.  As we discussed appendicitis needs to be ruled out as a source of your fever and elevated white blood cell count.  I am recommending that she be evaluated in the emergency department for that.  We discussed vertigo and he would indicated at [facility].  Please go to the ER at [facility] for further evaluation and to rule out appendicitis.

## 2020-06-15 LAB — SARS CORONAVIRUS 2 (TAT 6-24 HRS): SARS Coronavirus 2: NEGATIVE

## 2020-11-21 ENCOUNTER — Encounter: Payer: Self-pay | Admitting: Emergency Medicine

## 2020-11-21 ENCOUNTER — Ambulatory Visit
Admission: EM | Admit: 2020-11-21 | Discharge: 2020-11-21 | Disposition: A | Discharge status: Home / Self Care | Payer: BC Managed Care – PPO | Attending: Family Medicine | Admitting: Family Medicine

## 2020-11-21 ENCOUNTER — Other Ambulatory Visit: Payer: Self-pay

## 2020-11-21 DIAGNOSIS — F17210 Nicotine dependence, cigarettes, uncomplicated: Secondary | ICD-10-CM | POA: Insufficient documentation

## 2020-11-21 DIAGNOSIS — R059 Cough, unspecified: Secondary | ICD-10-CM | POA: Diagnosis present

## 2020-11-21 DIAGNOSIS — Z880 Allergy status to penicillin: Secondary | ICD-10-CM | POA: Insufficient documentation

## 2020-11-21 DIAGNOSIS — R112 Nausea with vomiting, unspecified: Secondary | ICD-10-CM | POA: Diagnosis not present

## 2020-11-21 DIAGNOSIS — Z20822 Contact with and (suspected) exposure to covid-19: Secondary | ICD-10-CM | POA: Diagnosis not present

## 2020-11-21 DIAGNOSIS — R197 Diarrhea, unspecified: Secondary | ICD-10-CM | POA: Diagnosis not present

## 2020-11-21 DIAGNOSIS — J029 Acute pharyngitis, unspecified: Secondary | ICD-10-CM | POA: Insufficient documentation

## 2020-11-21 DIAGNOSIS — J069 Acute upper respiratory infection, unspecified: Secondary | ICD-10-CM | POA: Diagnosis not present

## 2020-11-21 DIAGNOSIS — R0981 Nasal congestion: Secondary | ICD-10-CM

## 2020-11-21 LAB — PREGNANCY, URINE: Preg Test, Ur: NEGATIVE

## 2020-11-21 LAB — SARS CORONAVIRUS 2 (TAT 6-24 HRS): SARS Coronavirus 2: NEGATIVE

## 2020-11-21 LAB — GROUP A STREP BY PCR: Group A Strep by PCR: NOT DETECTED

## 2020-11-21 MED ORDER — ONDANSETRON HCL 4 MG PO TABS: 4.0000 mg | ORAL_TABLET | Freq: Four times a day (QID) | ORAL | 0 refills | Status: DC

## 2020-11-21 MED ORDER — PROMETHAZINE-DM 6.25-15 MG/5ML PO SYRP: 5.0000 mL | ORAL_SOLUTION | Freq: Four times a day (QID) | ORAL | 0 refills | Status: DC | PRN

## 2020-11-21 NOTE — ED Triage Notes (Signed)
Pt c/o nasal congestion, cough, subjective fever, headache and body aches. Started about 3-4 days ago.

## 2020-11-21 NOTE — ED Provider Notes (Signed)
[facility]    CSN: [id number] Arrival date & time: 11/21/20  4166      History   Chief Complaint Chief Complaint  Patient presents with  . Nasal Congestion  . Cough    HPI Catherine Bruce is a 25 y.o. female with 3 to 4-day history of fatigue, headaches, body aches, feeling feverish, nasal congestion, cough, sore throat, nausea and vomiting.  She is denying any shortness of breath, chest pain, or abdominal pain.  Also admits to some loose stools.  Patient says that her sister-in-law was ill with similar symptoms but no one has tested positive for influenza or COVID-19 or strep throat.  Patient has been vaccinated for COVID-19.  Patient says she has not taken Alka-Seltzer for symptoms.  She has no other complaints or concerns today.  Also patient admits to irregular menstrual periods.  She says that her last period was about 3 months ago.  She does have a Nexplanon.  HPI  Past Medical History:  Diagnosis Date  . Anemia   . Hypotension   . Medical history non-contributory   . Mental disorder    Bi Polar Disorder, anxiety    Patient Active Problem List   Diagnosis Date Noted  . NSVD (normal spontaneous vaginal delivery) 12/12/2013  . Late prenatal care complicating pregnancy in third trimester 11/30/2013  . Rubella non-immune status, antepartum 11/29/2013    Past Surgical History:  Procedure Laterality Date  . NO PAST SURGERIES      OB History    Gravida  3   Para  3   Term  3   Preterm  0   AB  0   Living  3     SAB  0   IAB  0   Ectopic  0   Multiple  0   Live Births  3            Home Medications    Prior to Admission medications   Medication Sig Start Date End Date Taking? Authorizing Provider  etonogestrel (NEXPLANON) 68 MG IMPL implant 1 each by Subdermal route once.   Yes [provider]  ondansetron (ZOFRAN) 4 MG tablet Take 1 tablet (4 mg total) by mouth every 6 (six) hours. 11/21/20  Yes Shirlee Latch, PA-C   promethazine-dextromethorphan (PROMETHAZINE-DM) 6.25-15 MG/5ML syrup Take 5 mLs by mouth 4 (four) times daily as needed for cough. 11/21/20  Yes Shirlee Latch, PA-C    Family History Family History  Problem Relation Age of Onset  . Asthma Mother   . Asthma Son     Social History Social History   Tobacco Use  . Smoking status: Current Every Day Smoker    Packs/day: 0.25    Types: Cigarettes  . Smokeless tobacco: Never Used  Substance Use Topics  . Alcohol use: Not Currently  . Drug use: No     Allergies   Penicillins   Review of Systems Review of Systems  Constitutional: Positive for fatigue. Negative for chills, diaphoresis and fever.  HENT: Positive for congestion, rhinorrhea and sore throat. Negative for ear pain, sinus pressure and sinus pain.   Respiratory: Positive for cough. Negative for shortness of breath.   Cardiovascular: Negative for chest pain.  Gastrointestinal: Positive for nausea and vomiting. Negative for abdominal pain.  Musculoskeletal: Positive for myalgias. Negative for arthralgias.  Skin: Negative for rash.  Neurological: Positive for headaches. Negative for weakness.  Hematological: Negative for adenopathy.     Physical Exam  Triage Vital Signs ED Triage Vitals  Enc Vitals Group     BP 11/21/20 0950 114/76     Pulse Rate 11/21/20 0950 (!) 101     Resp 11/21/20 0950 18     Temp 11/21/20 0950 98.7 F (37.1 C)     Temp Source 11/21/20 0950 Oral     SpO2 11/21/20 0950 100 %     Weight 11/21/20 0951 126 lb 1.7 oz (57.2 kg)     Height 11/21/20 0951 4' 9.5" (1.461 m)     Head Circumference --      Peak Flow --      Pain Score 11/21/20 0951 7     Pain Loc --      Pain Edu? --      Excl. in GC? --    No data found.  Updated Vital Signs BP 114/76 (BP Location: Left Arm)   Pulse (!) 101   Temp 98.7 F (37.1 C) (Oral)   Resp 18   Ht 4' 9.5" (1.461 m)   Wt 126 lb 1.7 oz (57.2 kg)   LMP 08/23/2020 (Approximate)   SpO2 100%   BMI  26.82 kg/m    Physical Exam Vitals and nursing note reviewed.  Constitutional:      General: She is not in acute distress.    Appearance: Normal appearance. She is not ill-appearing or toxic-appearing.  HENT:     Head: Normocephalic and atraumatic.     Nose: Congestion and rhinorrhea present.     Mouth/Throat:     Mouth: Mucous membranes are moist.     Pharynx: Oropharynx is clear. Posterior oropharyngeal erythema present.     Tonsils: 1+ on the right. 1+ on the left.  Eyes:     General: No scleral icterus.       Right eye: No discharge.        Left eye: No discharge.     Conjunctiva/sclera: Conjunctivae normal.  Cardiovascular:     Rate and Rhythm: Normal rate and regular rhythm.     Heart sounds: Normal heart sounds.  Pulmonary:     Effort: Pulmonary effort is normal. No respiratory distress.     Breath sounds: Normal breath sounds.  Musculoskeletal:     Cervical back: Neck supple.  Skin:    General: Skin is dry.  Neurological:     General: No focal deficit present.     Mental Status: She is alert. Mental status is at baseline.     Motor: No weakness.     Gait: Gait normal.  Psychiatric:        Mood and Affect: Mood normal.        Behavior: Behavior normal.        Thought Content: Thought content normal.      UC Treatments / Results  Labs (all labs ordered are listed, but only abnormal results are displayed) Labs Reviewed  SARS CORONAVIRUS 2 (TAT 6-24 HRS)  GROUP A STREP BY PCR  PREGNANCY, URINE    EKG   Radiology No results found.  Procedures Procedures (including critical care time)  Medications Ordered in UC Medications - No data to display  Initial Impression / Assessment and Plan / UC Course  I have reviewed the triage vital signs and the nursing notes.  Pertinent labs & imaging results that were available during my care of the patient were reviewed by me and considered in my medical decision making (see chart for details).   25 year old  female presenting  for 3 to 4-day history of cough, congestion, sore throat, headaches, body aches, fatigue, nausea and vomiting.  All vital signs are stable.  Molecular strep test obtained. Negative.  Pregnancy test was negative today.  Send a COVID-19 test obtained.  Current CDC guidelines, isolation protocol and ED precautions reviewed patient.  Advised likely viral illness. Encouraged supportive care increasing rest and fluids.  Sent Promethazine DM and Zofran.  Also provided work note.  Advised patient to follow-up with [place] as needed for any worsening symptoms.   Final Clinical Impressions(s) / UC Diagnoses   Final diagnoses:  Upper respiratory tract infection, unspecified type  Cough  Nasal congestion  Sore throat  Non-intractable vomiting with nausea, unspecified vomiting type     Discharge Instructions     I will call if strep is positive.   URI/COLD SYMPTOMS: Your exam today is consistent with a viral illness. Antibiotics are not indicated at this time. Use medications as directed, including cough syrup, nasal saline, and decongestants. Your symptoms should improve over the next few days and resolve within 7-10 days. Increase rest and fluids. F/u if symptoms worsen or predominate such as sore throat, ear pain, productive cough, shortness of breath, or if you develop high fevers or worsening fatigue over the next several days.    You have received COVID testing today either for positive exposure, concerning symptoms that could be related to COVID infection, screening purposes, or re-testing after confirmed positive.  Your test obtained today checks for active viral infection in the last 1-2 weeks. If your test is negative now, you can still test positive later. So, if you do develop symptoms you should either get re-tested and/or isolate x 5 days and then strict mask use x 5 days (unvaccinated) or mask use x 10 days (vaccinated). Please follow CDC guidelines.  While Rapid  antigen tests come back in 15-20 minutes, send out PCR/molecular test results typically come back within 1-3 days. In the mean time, if you are symptomatic, assume this could be a positive test and treat/monitor yourself as if you do have COVID.   We will call with test results if positive. Please download the MyChart app and set up a profile to access test results.   If symptomatic, go home and rest. Push fluids. Take Tylenol as needed for discomfort. Gargle warm salt water. Throat lozenges. Take Mucinex DM or Robitussin for cough. Humidifier in bedroom to ease coughing. Warm showers. Also review the COVID handout for more information.  COVID-19 INFECTION: The incubation period of COVID-19 is approximately 14 days after exposure, with most symptoms developing in roughly 4-5 days. Symptoms may range in severity from mild to critically severe. Roughly 80% of those infected will have mild symptoms. People of any age may become infected with COVID-19 and have the ability to transmit the virus. The most common symptoms include: fever, fatigue, cough, body aches, headaches, sore throat, nasal congestion, shortness of breath, nausea, vomiting, diarrhea, changes in smell and/or taste.    COURSE OF ILLNESS Some patients may begin with mild disease which can progress quickly into critical symptoms. If your symptoms are worsening please call ahead to the Emergency Department and proceed there for further treatment. Recovery time appears to be roughly 1-2 weeks for mild symptoms and 3-6 weeks for severe disease.   GO IMMEDIATELY TO ER FOR FEVER YOU ARE UNABLE TO GET DOWN WITH TYLENOL, BREATHING PROBLEMS, CHEST PAIN, FATIGUE, LETHARGY, INABILITY TO EAT OR DRINK, ETC  QUARANTINE AND ISOLATION: To help  decrease the spread of COVID-19 please remain isolated if you have COVID infection or are highly suspected to have COVID infection. This means -stay home and isolate to one room in the home if you live with others. Do  not share a bed or bathroom with others while ill, sanitize and wipe down all countertops and keep common areas clean and disinfected. Stay home for 5 days. If you have no symptoms or your symptoms are resolving after 5 days, you can leave your house. Continue to wear a mask around others for 5 additional days. If you have been in close contact (within 6 feet) of someone diagnosed with COVID 19, you are advised to quarantine in your home for 14 days as symptoms can develop anywhere from 2-14 days after exposure to the virus. If you develop symptoms, you  must isolate.  Most current guidelines for COVID after exposure -unvaccinated: isolate 5 days and strict mask use x 5 days. Test on day 5 is possible -vaccinated: wear mask x 10 days if symptoms do not develop -You do not necessarily need to be tested for COVID if you have + exposure and  develop symptoms. Just isolate at home x10 days from symptom onset During this global pandemic, CDC advises to practice social distancing, try to stay at least 38ft away from others at all times. Wear a face covering. Wash and sanitize your hands regularly and avoid going anywhere that is not necessary.  KEEP IN MIND THAT THE COVID TEST IS NOT 100% ACCURATE AND YOU SHOULD STILL DO EVERYTHING TO PREVENT POTENTIAL SPREAD OF VIRUS TO OTHERS (WEAR MASK, WEAR GLOVES, WASH HANDS AND SANITIZE REGULARLY). IF INITIAL TEST IS NEGATIVE, THIS MAY NOT MEAN YOU ARE DEFINITELY NEGATIVE. MOST ACCURATE TESTING IS DONE 5-7 DAYS AFTER EXPOSURE.   It is not advised by CDC to get re-tested after receiving a positive COVID test since you can still test positive for weeks to months after you have already cleared the virus.   *If you have not been vaccinated for COVID, I strongly suggest you consider getting vaccinated as long as there are no contraindications.      ED Prescriptions    Medication Sig Dispense Auth. Provider   promethazine-dextromethorphan (PROMETHAZINE-DM) 6.25-15 MG/5ML  syrup Take 5 mLs by mouth 4 (four) times daily as needed for cough. 118 mL Eusebio Friendly B, PA-C   ondansetron (ZOFRAN) 4 MG tablet Take 1 tablet (4 mg total) by mouth every 6 (six) hours. 12 tablet Gareth Morgan     PDMP not reviewed this encounter.   Shirlee Latch, PA-C 11/21/20 1112

## 2020-11-21 NOTE — Discharge Instructions (Addendum)
I will call if strep is positive.   URI/COLD SYMPTOMS: Your exam today is consistent with a viral illness. Antibiotics are not indicated at this time. Use medications as directed, including cough syrup, nasal saline, and decongestants. Your symptoms should improve over the next few days and resolve within 7-10 days. Increase rest and fluids. F/u if symptoms worsen or predominate such as sore throat, ear pain, productive cough, shortness of breath, or if you develop high fevers or worsening fatigue over the next several days.    You have received COVID testing today either for positive exposure, concerning symptoms that could be related to COVID infection, screening purposes, or re-testing after confirmed positive.  Your test obtained today checks for active viral infection in the last 1-2 weeks. If your test is negative now, you can still test positive later. So, if you do develop symptoms you should either get re-tested and/or isolate x 5 days and then strict mask use x 5 days (unvaccinated) or mask use x 10 days (vaccinated). Please follow CDC guidelines.  While Rapid antigen tests come back in 15-20 minutes, send out PCR/molecular test results typically come back within 1-3 days. In the mean time, if you are symptomatic, assume this could be a positive test and treat/monitor yourself as if you do have COVID.   We will call with test results if positive. Please download the MyChart app and set up a profile to access test results.   If symptomatic, go home and rest. Push fluids. Take Tylenol as needed for discomfort. Gargle warm salt water. Throat lozenges. Take Mucinex DM or Robitussin for cough. Humidifier in bedroom to ease coughing. Warm showers. Also review the COVID handout for more information.  COVID-19 INFECTION: The incubation period of COVID-19 is approximately 14 days after exposure, with most symptoms developing in roughly 4-5 days. Symptoms may range in severity from mild to critically  severe. Roughly 80% of those infected will have mild symptoms. People of any age may become infected with COVID-19 and have the ability to transmit the virus. The most common symptoms include: fever, fatigue, cough, body aches, headaches, sore throat, nasal congestion, shortness of breath, nausea, vomiting, diarrhea, changes in smell and/or taste.    COURSE OF ILLNESS Some patients may begin with mild disease which can progress quickly into critical symptoms. If your symptoms are worsening please call ahead to the Emergency Department and proceed there for further treatment. Recovery time appears to be roughly 1-2 weeks for mild symptoms and 3-6 weeks for severe disease.   GO IMMEDIATELY TO ER FOR FEVER YOU ARE UNABLE TO GET DOWN WITH TYLENOL, BREATHING PROBLEMS, CHEST PAIN, FATIGUE, LETHARGY, INABILITY TO EAT OR DRINK, ETC  QUARANTINE AND ISOLATION: To help decrease the spread of COVID-19 please remain isolated if you have COVID infection or are highly suspected to have COVID infection. This means -stay home and isolate to one room in the home if you live with others. Do not share a bed or bathroom with others while ill, sanitize and wipe down all countertops and keep common areas clean and disinfected. Stay home for 5 days. If you have no symptoms or your symptoms are resolving after 5 days, you can leave your house. Continue to wear a mask around others for 5 additional days. If you have been in close contact (within 6 feet) of someone diagnosed with COVID 19, you are advised to quarantine in your home for 14 days as symptoms can develop anywhere from 2-14 days after exposure  to the virus. If you develop symptoms, you  must isolate.  Most current guidelines for COVID after exposure -unvaccinated: isolate 5 days and strict mask use x 5 days. Test on day 5 is possible -vaccinated: wear mask x 10 days if symptoms do not develop -You do not necessarily need to be tested for COVID if you have + exposure  and  develop symptoms. Just isolate at home x10 days from symptom onset During this global pandemic, CDC advises to practice social distancing, try to stay at least 50ft away from others at all times. Wear a face covering. Wash and sanitize your hands regularly and avoid going anywhere that is not necessary.  KEEP IN MIND THAT THE COVID TEST IS NOT 100% ACCURATE AND YOU SHOULD STILL DO EVERYTHING TO PREVENT POTENTIAL SPREAD OF VIRUS TO OTHERS (WEAR MASK, WEAR GLOVES, WASH HANDS AND SANITIZE REGULARLY). IF INITIAL TEST IS NEGATIVE, THIS MAY NOT MEAN YOU ARE DEFINITELY NEGATIVE. MOST ACCURATE TESTING IS DONE 5-7 DAYS AFTER EXPOSURE.   It is not advised by CDC to get re-tested after receiving a positive COVID test since you can still test positive for weeks to months after you have already cleared the virus.   *If you have not been vaccinated for COVID, I strongly suggest you consider getting vaccinated as long as there are no contraindications.

## 2020-12-08 ENCOUNTER — Other Ambulatory Visit: Payer: Self-pay

## 2020-12-08 ENCOUNTER — Ambulatory Visit
Admission: EM | Admit: 2020-12-08 | Discharge: 2020-12-08 | Disposition: A | Discharge status: Home / Self Care | Payer: BC Managed Care – PPO | Attending: Family Medicine | Admitting: Family Medicine

## 2020-12-08 ENCOUNTER — Ambulatory Visit (INDEPENDENT_AMBULATORY_CARE_PROVIDER_SITE_OTHER): Payer: BC Managed Care – PPO

## 2020-12-08 DIAGNOSIS — J209 Acute bronchitis, unspecified: Secondary | ICD-10-CM | POA: Diagnosis not present

## 2020-12-08 DIAGNOSIS — R059 Cough, unspecified: Secondary | ICD-10-CM | POA: Diagnosis not present

## 2020-12-08 DIAGNOSIS — Z20822 Contact with and (suspected) exposure to covid-19: Secondary | ICD-10-CM | POA: Diagnosis not present

## 2020-12-08 MED ORDER — DOXYCYCLINE HYCLATE 100 MG PO CAPS: 100.0000 mg | ORAL_CAPSULE | Freq: Two times a day (BID) | ORAL | 0 refills | Status: DC

## 2020-12-08 NOTE — ED Triage Notes (Signed)
Patient states that she has been having a cough x 3-4 weeks now. Reports that she was seen here on 04/20 and given cough medicine with codeine. States that she feels like the cough will not let up. Reports that she is now having left sided rib pain from the cough.

## 2020-12-09 LAB — SARS CORONAVIRUS 2 (TAT 6-24 HRS): SARS Coronavirus 2: NEGATIVE

## 2020-12-11 NOTE — ED Provider Notes (Signed)
[facility]    CSN: [id number] Arrival date & time: 12/08/20  1440      History   Chief Complaint Chief Complaint  Patient presents with  . Cough   HPI  25 year old female presents with the above complaint.  Patient recently seen on 4/20.  Diagnosed with respiratory tract infection.  Patient reports that she has not improved.  Continues to have cough.  Cough is productive.  She has been using cough medicine without resolution. Associated rib pain. No fever. No other complaints.   Past Medical History:  Diagnosis Date  . Anemia   . Hypotension   . Medical history non-contributory   . Mental disorder    Bi Polar Disorder, anxiety    Patient Active Problem List   Diagnosis Date Noted  . NSVD (normal spontaneous vaginal delivery) 12/12/2013  . Late prenatal care complicating pregnancy in third trimester 11/30/2013  . Rubella non-immune status, antepartum 11/29/2013    Past Surgical History:  Procedure Laterality Date  . NO PAST SURGERIES      OB History    Gravida  3   Para  3   Term  3   Preterm  0   AB  0   Living  3     SAB  0   IAB  0   Ectopic  0   Multiple  0   Live Births  3            Home Medications    Prior to Admission medications   Medication Sig Start Date End Date Taking? Authorizing Provider  doxycycline (VIBRAMYCIN) 100 MG capsule Take 1 capsule (100 mg total) by mouth 2 (two) times daily. 12/08/20  Yes Codee Bloodworth G, DO  etonogestrel (NEXPLANON) 68 MG IMPL implant 1 each by Subdermal route once.   Yes [provider]    Family History Family History  Problem Relation Age of Onset  . Asthma Mother   . Asthma Son     Social History Social History   Tobacco Use  . Smoking status: Current Every Day Smoker    Packs/day: 0.25    Types: Cigarettes  . Smokeless tobacco: Never Used  Substance Use Topics  . Alcohol use: Not Currently  . Drug use: No     Allergies   Penicillins   Review of  Systems Review of Systems  Respiratory: Positive for cough.   Musculoskeletal:       Rib pain.   Physical Exam Triage Vital Signs ED Triage Vitals  Enc Vitals Group     BP 12/08/20 1452 98/75     Pulse Rate 12/08/20 1452 74     Resp 12/08/20 1452 18     Temp 12/08/20 1452 98.4 F (36.9 C)     Temp Source 12/08/20 1452 Oral     SpO2 12/08/20 1452 100 %     Weight 12/08/20 1449 121 lb 6.4 oz (55.1 kg)     Height 12/08/20 1449 4\' 11"  (1.499 m)     Head Circumference --      Peak Flow --      Pain Score 12/08/20 1448 6     Pain Loc --      Pain Edu? --      Excl. in GC? --    Updated Vital Signs BP 98/75 (BP Location: Left Arm)   Pulse 74   Temp 98.4 F (36.9 C) (Oral)   Resp 18   Ht 4\' 11"  (1.499 m)  Wt 55.1 kg   SpO2 100%   BMI 24.52 kg/m   Visual Acuity Right Eye Distance:   Left Eye Distance:   Bilateral Distance:    Right Eye Near:   Left Eye Near:    Bilateral Near:     Physical Exam Constitutional:      General: She is not in acute distress.    Appearance: Normal appearance. She is not ill-appearing.  HENT:     Head: Normocephalic and atraumatic.  Eyes:     General:        Right eye: No discharge.        Left eye: No discharge.     Conjunctiva/sclera: Conjunctivae normal.  Cardiovascular:     Rate and Rhythm: Normal rate and regular rhythm.     Heart sounds: No murmur heard.   Pulmonary:     Effort: Pulmonary effort is normal.     Breath sounds: No wheezing, rhonchi or rales.  Neurological:     Mental Status: She is alert.  Psychiatric:        Mood and Affect: Mood normal.        Behavior: Behavior normal.    UC Treatments / Results  Labs (all labs ordered are listed, but only abnormal results are displayed) Labs Reviewed  SARS CORONAVIRUS 2 (TAT 6-24 HRS)    EKG   Radiology No results found.  Procedures Procedures (including critical care time)  Medications Ordered in UC Medications - No data to display  Initial  Impression / Assessment and Plan / UC Course  I have reviewed the triage vital signs and the nursing notes.  Pertinent labs & imaging results that were available during my care of the patient were reviewed by me and considered in my medical decision making (see chart for details).    25 year old female presents with acute bronchitis.  Chest x-ray was obtained and was independent reviewed by me.  Interpretation: Normal chest x-ray.  No evidence of pneumonia.  Given duration of symptoms and lack of improvement, I am placing her on doxycycline.  Final Clinical Impressions(s) / UC Diagnoses   Final diagnoses:  Acute bronchitis, unspecified organism   Discharge Instructions   None    ED Prescriptions    Medication Sig Dispense Auth. Provider   doxycycline (VIBRAMYCIN) 100 MG capsule Take 1 capsule (100 mg total) by mouth 2 (two) times daily. 14 capsule Everlene Other G, DO     PDMP not reviewed this encounter.   Tommie Sams, [place] 12/11/20 [phone]

## 2021-05-06 NOTE — Progress Notes (Signed)
PAP Letter mailed to patient today.  PAP due 04-2021. Hart Carwin, RN

## 2021-05-10 ENCOUNTER — Telehealth: Payer: Self-pay | Admitting: Family Medicine

## 2021-05-10 NOTE — Telephone Encounter (Signed)
Pt. Received a letter stating to repeat PAP due to abnormal history (Catherine Bruce). Please call the patient or let the clerk know to proceed with the appointment scheduling. 

## 2021-05-10 NOTE — Telephone Encounter (Signed)
Call to client and per recorded message, voicemail is not set up. Jossie Ng, RN

## 2021-05-10 NOTE — Telephone Encounter (Signed)
PE with PAP scheduled for 05/25/2021 with arrival time of 3:30 pm. Jossie Ng, RN

## 2021-05-10 NOTE — Telephone Encounter (Deleted)
Pt. Received a letter stating to repeat PAP due to abnormal history (Catherine Bruce). Please call the patient or let the clerk know to proceed with the appointment scheduling.

## 2021-05-10 NOTE — Telephone Encounter (Signed)
Call to client and PE with PAP scheduled for 05/14/2021 with arrival time of 1530. Jossie Ng, RN

## 2021-05-14 ENCOUNTER — Ambulatory Visit: Payer: BC Managed Care – PPO

## 2021-07-02 ENCOUNTER — Other Ambulatory Visit: Payer: Self-pay

## 2021-07-02 ENCOUNTER — Ambulatory Visit
Admission: EM | Admit: 2021-07-02 | Discharge: 2021-07-02 | Disposition: A | Discharge status: Home / Self Care | Payer: BC Managed Care – PPO | Attending: Emergency Medicine | Admitting: Emergency Medicine

## 2021-07-02 ENCOUNTER — Encounter: Payer: Self-pay | Admitting: Emergency Medicine

## 2021-07-02 DIAGNOSIS — J111 Influenza due to unidentified influenza virus with other respiratory manifestations: Secondary | ICD-10-CM | POA: Diagnosis not present

## 2021-07-02 MED ORDER — OSELTAMIVIR PHOSPHATE 75 MG PO CAPS: 75.0000 mg | ORAL_CAPSULE | Freq: Two times a day (BID) | ORAL | 0 refills | Status: AC

## 2021-07-02 MED ORDER — BENZONATATE 100 MG PO CAPS: 200.0000 mg | ORAL_CAPSULE | Freq: Three times a day (TID) | ORAL | 0 refills | Status: AC

## 2021-07-02 MED ORDER — PROMETHAZINE-DM 6.25-15 MG/5ML PO SYRP: 5.0000 mL | ORAL_SOLUTION | Freq: Four times a day (QID) | ORAL | 0 refills | Status: AC | PRN

## 2021-07-02 MED ORDER — IPRATROPIUM BROMIDE 0.06 % NA SOLN: 2.0000 | Freq: Four times a day (QID) | NASAL | 12 refills | Status: AC

## 2021-07-02 NOTE — ED Triage Notes (Signed)
Pt c/o cough, sore throat, headache, body aches, subjective fever. Started yesterday.

## 2021-07-02 NOTE — Discharge Instructions (Signed)
Take the Tamiflu twice daily for 5 days for treatment of influenza.  Use the Atrovent nasal spray, 2 squirts up each nostril every 6 hours, as needed for nasal congestion and runny nose.  Use over-the-counter Delsym, Zarbee's, or Robitussin during the day as needed for cough.  Use the Tessalon Perles every 8 hours as needed for cough.  Taken with a small sip of water.  You may experience some numbness to your tongue or metallic taste in her mouth, this is normal.  Use the Promethazine DM cough syrup at bedtime as will make you drowsy but it should help dry up your postnasal drip and aid you in sleep and cough relief.  Use OTC Tylenol and Ibuprofen as needed for fever and pain.  Return for reevaluation, or see your primary care provider, for new or worsening symptoms.

## 2021-07-02 NOTE — ED Provider Notes (Signed)
[facility]    CSN: [id number] Arrival date & time: 07/02/21  1113      History   Chief Complaint Chief Complaint  Patient presents with   Cough    HPI Catherine Bruce is a 25 y.o. female.   HPI  63 old female here for evaluation of flulike illness.  Patient reports that she developed a sore throat and mild headache yesterday and then this morning when she woke up she was experiencing runny nose and nasal congestion, ear pain on the right, developed a cough that is intermittently productive, worsening headache, body aches, and sore throat.  She denies shortness of breath or wheezing and she denies GI complaints.  Past Medical History:  Diagnosis Date   Anemia    Hypotension    Medical history non-contributory    Mental disorder    Bi Polar Disorder, anxiety    Patient Active Problem List   Diagnosis Date Noted   NSVD (normal spontaneous vaginal delivery) 12/12/2013   Late prenatal care complicating pregnancy in third trimester 11/30/2013   Rubella non-immune status, antepartum 11/29/2013    Past Surgical History:  Procedure Laterality Date   NO PAST SURGERIES      OB History     Gravida  3   Para  3   Term  3   Preterm  0   AB  0   Living  3      SAB  0   IAB  0   Ectopic  0   Multiple  0   Live Births  3            Home Medications    Prior to Admission medications   Medication Sig Start Date End Date Taking? Authorizing Provider  benzonatate (TESSALON) 100 MG capsule Take 2 capsules (200 mg total) by mouth every 8 (eight) hours. 07/02/21  Yes Becky Augusta, NP  etonogestrel (NEXPLANON) 68 MG IMPL implant 1 each by Subdermal route once.   Yes [provider]  ipratropium (ATROVENT) 0.06 % nasal spray Place 2 sprays into both nostrils 4 (four) times daily. 07/02/21  Yes Becky Augusta, NP  oseltamivir (TAMIFLU) 75 MG capsule Take 1 capsule (75 mg total) by mouth every 12 (twelve) hours. 07/02/21  Yes Becky Augusta,  NP  promethazine-dextromethorphan (PROMETHAZINE-DM) 6.25-15 MG/5ML syrup Take 5 mLs by mouth 4 (four) times daily as needed. 07/02/21  Yes Becky Augusta, NP    Family History Family History  Problem Relation Age of Onset   Asthma Mother    Asthma Son     Social History Social History   Tobacco Use   Smoking status: Every Day    Types: E-cigarettes   Smokeless tobacco: Never  Vaping Use   Vaping Use: Every day  Substance Use Topics   Alcohol use: Not Currently   Drug use: No     Allergies   Penicillins   Review of Systems Review of Systems  Constitutional:  Positive for fever. Negative for activity change and appetite change.  HENT:  Positive for congestion, ear pain, rhinorrhea and sore throat.   Respiratory:  Positive for cough. Negative for shortness of breath and wheezing.   Gastrointestinal:  Negative for diarrhea, nausea and vomiting.  Musculoskeletal:  Positive for arthralgias and myalgias.  Skin:  Negative for rash.  Neurological:  Positive for headaches.  Hematological: Negative.   Psychiatric/Behavioral: Negative.      Physical Exam Triage Vital Signs ED Triage Vitals  Enc Vitals  Group     BP 07/02/21 1340 106/70     Pulse Rate 07/02/21 1340 97     Resp 07/02/21 1340 18     Temp 07/02/21 1340 98.7 F (37.1 C)     Temp Source 07/02/21 1340 Oral     SpO2 07/02/21 1340 100 %     Weight 07/02/21 1338 121 lb 7.6 oz (55.1 kg)     Height 07/02/21 1338 4\' 11"  (1.499 m)     Head Circumference --      Peak Flow --      Pain Score 07/02/21 1337 6     Pain Loc --      Pain Edu? --      Excl. in GC? --    No data found.  Updated Vital Signs BP 106/70 (BP Location: Right Arm)   Pulse 97   Temp 98.7 F (37.1 C) (Oral)   Resp 18   Ht 4\' 11"  (1.499 m)   Wt 121 lb 7.6 oz (55.1 kg)   SpO2 100%   BMI 24.53 kg/m   Visual Acuity Right Eye Distance:   Left Eye Distance:   Bilateral Distance:    Right Eye Near:   Left Eye Near:    Bilateral Near:      Physical Exam Vitals and nursing note reviewed.  Constitutional:      General: She is not in acute distress.    Appearance: Normal appearance. She is normal weight. She is not ill-appearing.  HENT:     Head: Normocephalic and atraumatic.     Right Ear: Tympanic membrane, ear canal and external ear normal. There is no impacted cerumen.     Left Ear: Tympanic membrane, ear canal and external ear normal. There is no impacted cerumen.     Nose: Congestion and rhinorrhea present.     Mouth/Throat:     Mouth: Mucous membranes are moist.     Pharynx: Oropharynx is clear. Posterior oropharyngeal erythema present.  Cardiovascular:     Rate and Rhythm: Normal rate and regular rhythm.     Pulses: Normal pulses.     Heart sounds: No murmur heard.   No gallop.  Pulmonary:     Effort: Pulmonary effort is normal.     Breath sounds: Normal breath sounds. No wheezing, rhonchi or rales.  Musculoskeletal:     Cervical back: Normal range of motion and neck supple.  Lymphadenopathy:     Cervical: Cervical adenopathy present.  Skin:    General: Skin is warm and dry.     Capillary Refill: Capillary refill takes less than 2 seconds.     Findings: No erythema or rash.  Neurological:     General: No focal deficit present.     Mental Status: She is alert and oriented to person, place, and time.  Psychiatric:        Mood and Affect: Mood normal.        Behavior: Behavior normal.        Thought Content: Thought content normal.        Judgment: Judgment normal.     UC Treatments / Results  Labs (all labs ordered are listed, but only abnormal results are displayed) Labs Reviewed - No data to display  EKG   Radiology No results found.  Procedures Procedures (including critical care time)  Medications Ordered in UC Medications - No data to display  Initial Impression / Assessment and Plan / UC Course  I have reviewed the triage  vital signs and the nursing notes.  Pertinent labs &  imaging results that were available during my care of the patient were reviewed by me and considered in my medical decision making (see chart for details).  Patient is a pleasant, nontoxic-appearing 55 old female here for evaluation of influenza-like symptoms as outlined in HPI above.  On physical exam she has pearly-gray tympanic membranes bilaterally with normal light reflex and clear external auditory canals.  Nasal mucosa is erythematous and edematous with clear nasal discharge in both nares.  Oropharyngeal exam reveals posterior oropharyngeal erythema and injection with clear postnasal drip.  Patient does have bilateral anterior shotty cervical lymphadenopathy.  Cardiopulmonary exam is clear and sounds in all fields.  Patient exam is consistent with influenza.  We will treat patient with Tamiflu twice daily for 5 days and I will prescribe Atrovent nasal spray to help her with the nasal congestion, Tessalon Perles and Promethazine DM cough syrup help with cough and congestion.  Work note provided.   Final Clinical Impressions(s) / UC Diagnoses   Final diagnoses:  Influenza-like illness     Discharge Instructions      Take the Tamiflu twice daily for 5 days for treatment of influenza.  Use the Atrovent nasal spray, 2 squirts up each nostril every 6 hours, as needed for nasal congestion and runny nose.  Use over-the-counter Delsym, Zarbee's, or Robitussin during the day as needed for cough.  Use the Tessalon Perles every 8 hours as needed for cough.  Taken with a small sip of water.  You may experience some numbness to your tongue or metallic taste in her mouth, this is normal.  Use the Promethazine DM cough syrup at bedtime as will make you drowsy but it should help dry up your postnasal drip and aid you in sleep and cough relief.  Use OTC Tylenol and Ibuprofen as needed for fever and pain.  Return for reevaluation, or see your primary care provider, for new or worsening symptoms.       ED Prescriptions     Medication Sig Dispense Auth. Provider   oseltamivir (TAMIFLU) 75 MG capsule Take 1 capsule (75 mg total) by mouth every 12 (twelve) hours. 10 capsule Becky Augusta, NP   benzonatate (TESSALON) 100 MG capsule Take 2 capsules (200 mg total) by mouth every 8 (eight) hours. 21 capsule Becky Augusta, NP   ipratropium (ATROVENT) 0.06 % nasal spray Place 2 sprays into both nostrils 4 (four) times daily. 15 mL Becky Augusta, NP   promethazine-dextromethorphan (PROMETHAZINE-DM) 6.25-15 MG/5ML syrup Take 5 mLs by mouth 4 (four) times daily as needed. 118 mL Becky Augusta, NP      PDMP not reviewed this encounter.   Becky Augusta, NP 07/02/21 [id number]

## 2023-02-04 IMAGING — CR DG CHEST 2V
2 series · 2 of 2 positions shown · non-contrast
Comparison: 03/12/2008

CLINICAL DATA: Cough for several weeks

EXAM:
CHEST - 2 VIEW

[chest pa]
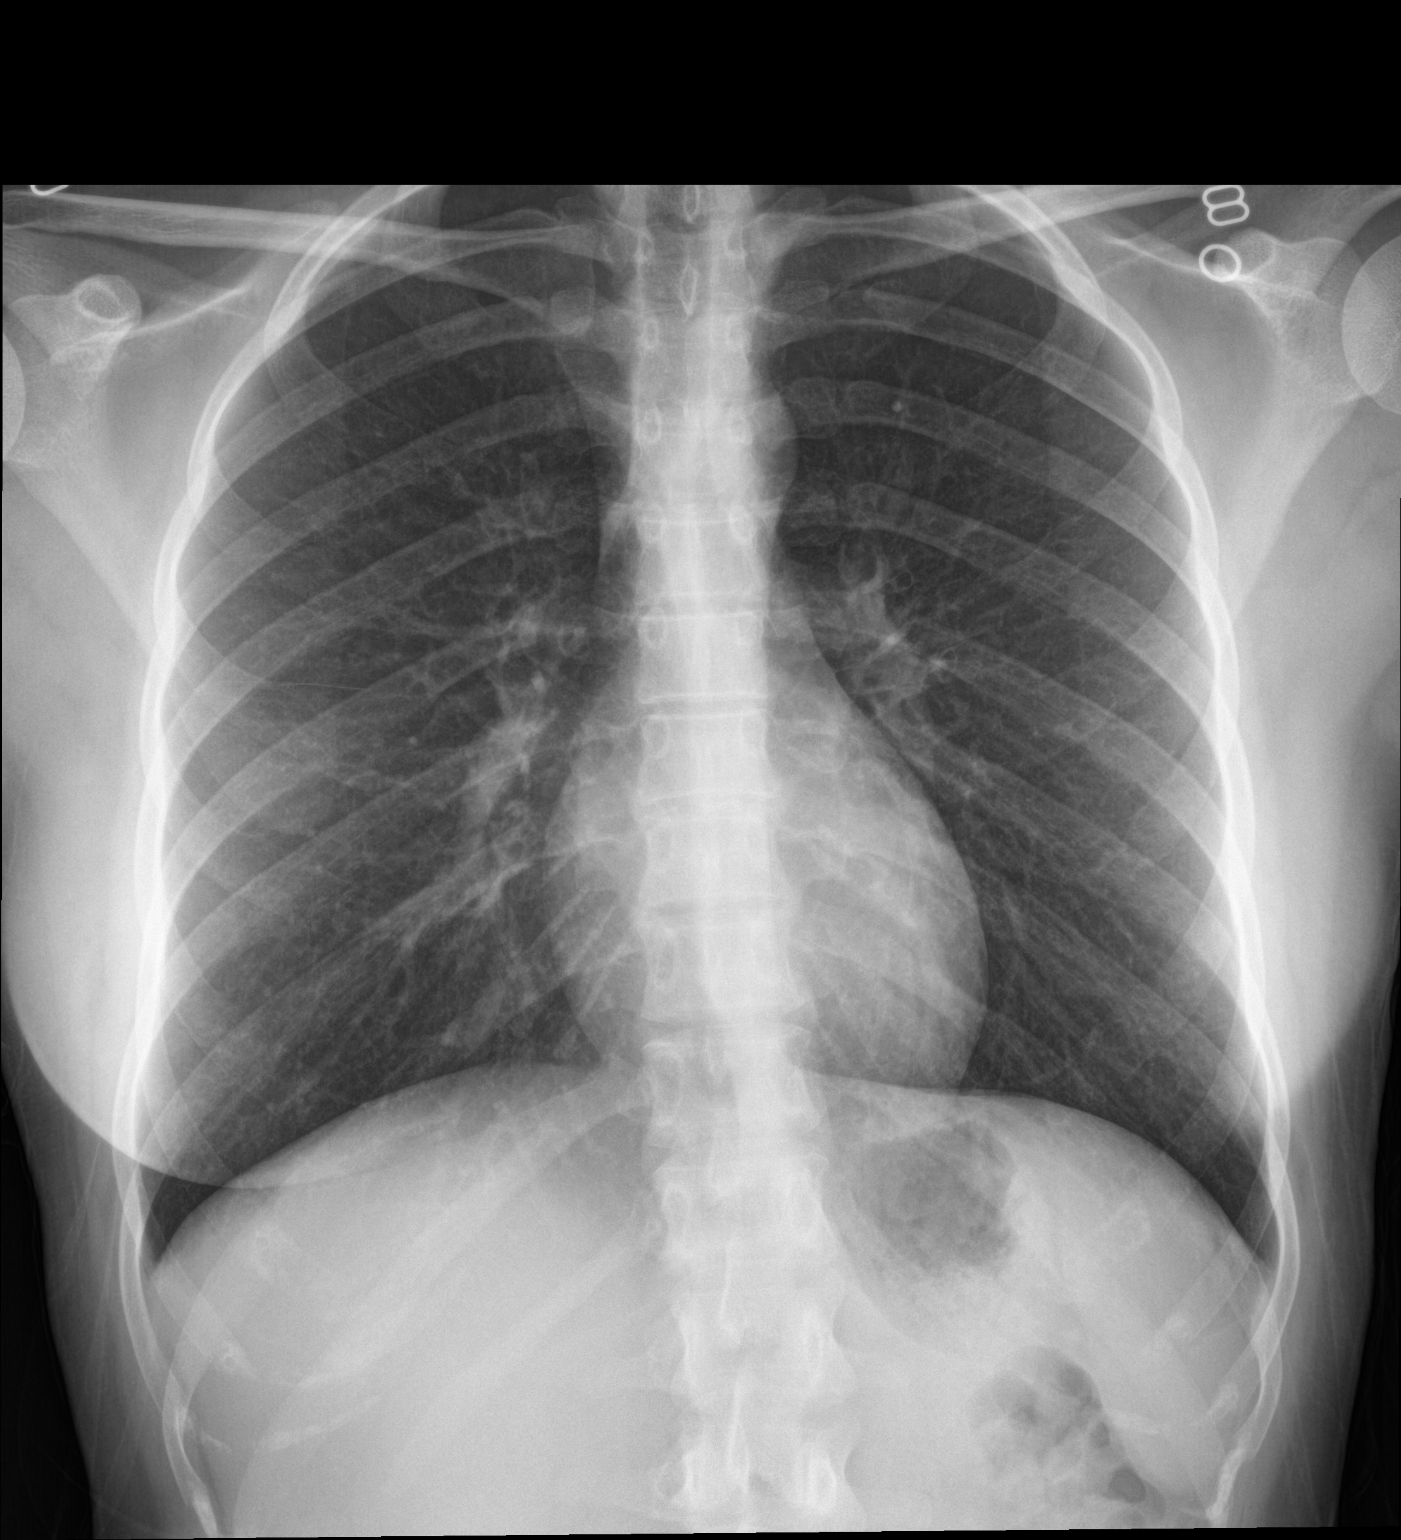

[chest lat]
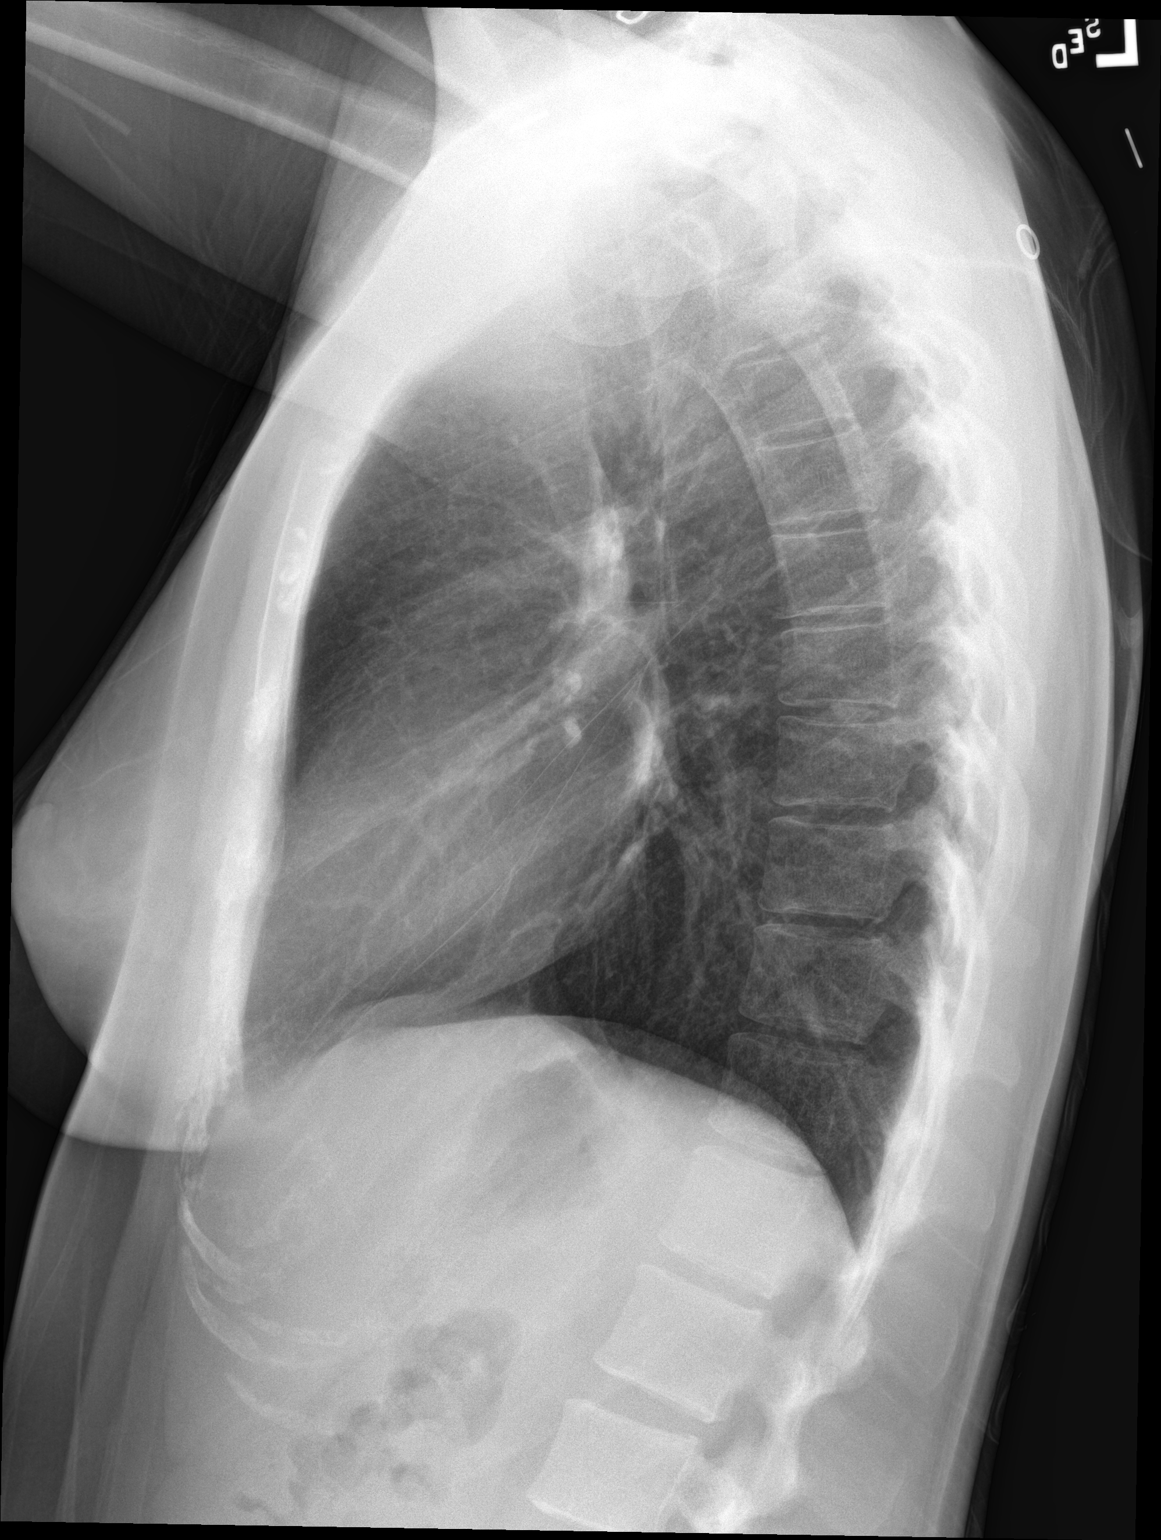

[2 of 2 positions shown; findings below may reference images not displayed]

FINDINGS: The heart size and mediastinal contours are within normal limits.
Both lungs are clear. The visualized skeletal structures are
unremarkable.
IMPRESSION: No active cardiopulmonary disease.
